# Patient Record
Sex: Male | Born: 1965 | Race: White | Hispanic: No | Smoking: Never smoker
Health system: Southern US, Community
[De-identification: ages and names within clinical notes are randomized; demographics above are authoritative.]

## PROBLEM LIST (undated history)

## (undated) DIAGNOSIS — F32A Depression, unspecified: Secondary | ICD-10-CM

## (undated) DIAGNOSIS — E1110 Type 2 diabetes mellitus with ketoacidosis without coma: Secondary | ICD-10-CM

## (undated) DIAGNOSIS — I10 Essential (primary) hypertension: Secondary | ICD-10-CM

## (undated) DIAGNOSIS — K219 Gastro-esophageal reflux disease without esophagitis: Secondary | ICD-10-CM

## (undated) DIAGNOSIS — E7800 Pure hypercholesterolemia, unspecified: Secondary | ICD-10-CM

## (undated) DIAGNOSIS — F329 Major depressive disorder, single episode, unspecified: Secondary | ICD-10-CM

## (undated) HISTORY — PX: EYE SURGERY: SHX253

## (undated) HISTORY — PX: VASECTOMY: SHX75

---

## 1999-07-08 ENCOUNTER — Encounter: Payer: Self-pay | Admitting: Emergency Medicine

## 1999-07-08 ENCOUNTER — Emergency Department (HOSPITAL_COMMUNITY): Admission: EM | Admit: 1999-07-08 | Discharge: 1999-07-08 | Payer: Self-pay | Admitting: Emergency Medicine

## 2005-03-29 ENCOUNTER — Emergency Department (HOSPITAL_COMMUNITY): Admission: EM | Admit: 2005-03-29 | Discharge: 2005-03-29 | Payer: Self-pay | Admitting: Family Medicine

## 2005-06-27 ENCOUNTER — Emergency Department (HOSPITAL_COMMUNITY): Admission: EM | Admit: 2005-06-27 | Discharge: 2005-06-27 | Payer: Self-pay | Admitting: Emergency Medicine

## 2005-12-18 ENCOUNTER — Emergency Department (HOSPITAL_COMMUNITY): Admission: EM | Admit: 2005-12-18 | Discharge: 2005-12-18 | Payer: Self-pay | Admitting: Family Medicine

## 2007-02-11 DIAGNOSIS — H359 Unspecified retinal disorder: Secondary | ICD-10-CM | POA: Insufficient documentation

## 2009-08-02 DIAGNOSIS — E559 Vitamin D deficiency, unspecified: Secondary | ICD-10-CM | POA: Insufficient documentation

## 2011-06-22 ENCOUNTER — Emergency Department (HOSPITAL_COMMUNITY)
Admission: EM | Admit: 2011-06-22 | Discharge: 2011-06-22 | Disposition: A | Discharge status: Home / Self Care | Payer: Self-pay | Source: Home / Self Care | Attending: Emergency Medicine | Admitting: Emergency Medicine

## 2011-06-22 ENCOUNTER — Encounter (HOSPITAL_COMMUNITY): Payer: Self-pay | Admitting: *Deleted

## 2011-06-22 DIAGNOSIS — A938 Other specified arthropod-borne viral fevers: Secondary | ICD-10-CM

## 2011-06-22 HISTORY — DX: Pure hypercholesterolemia, unspecified: E78.00

## 2011-06-22 HISTORY — DX: Major depressive disorder, single episode, unspecified: F32.9

## 2011-06-22 HISTORY — DX: Depression, unspecified: F32.A

## 2011-06-22 HISTORY — DX: Essential (primary) hypertension: I10

## 2011-06-22 LAB — GLUCOSE, CAPILLARY: Glucose-Capillary: 153 mg/dL — ABNORMAL HIGH (ref 70–99)

## 2011-06-22 MED ORDER — DOXYCYCLINE HYCLATE 100 MG PO CAPS: 100.0000 mg | ORAL_CAPSULE | Freq: Two times a day (BID) | ORAL | Status: AC

## 2011-06-22 NOTE — ED Provider Notes (Signed)
History     CSN: [id number]  Arrival date & time 06/22/11  1727   First MD Initiated Contact with Patient 06/22/11 1828      Chief Complaint  Patient presents with  . Rash  . Joint Pain  . Headache    (Consider location/radiation/quality/duration/timing/severity/associated sxs/prior treatment) HPI Comments: Patient states that he removed an embedded tick behind his right knee one week ago. Patient now reports 3 painful, target  lesions on his right lower extremity, one behind his knee, and 2 on the anterior aspect of his right lower leg. Reports right knee and left wrist arthralgias, diffuse headache without photophobia, nausea, vomiting, neck stiffness. He has been taking 800 mg ibuprofen and 1 g of Tylenol with mild relief of the headache and arthralgias. No aggravating factors. No conjunctivitis. No rash. No dysarthria, facial droop, blurry vision. No chest pain, shortness of breath, abdominal pain. Patient is a type I diabetic, on insulin pump. States glucose has  has been normal for him. States his tetanus is up-to-date.  Patient is a 46 y.o. male presenting with rash. The history is provided by the patient. No language interpreter was used.  Rash  This is a new problem. The current episode started more than 2 days ago. The problem has been gradually worsening. The problem is associated with an insect bite/sting. There has been no fever. The rash is present on the right lower leg. The pain has been constant since onset. He has tried OTC analgesics for the symptoms. The treatment provided mild relief.    Past Medical History  Diagnosis Date  . Diabetes mellitus   . Hypertension   . Hypercholesteremia   . Depression     Past Surgical History  Procedure Date  . Eye surgery     1992  . Vasectomy     History reviewed. No pertinent family history.  History  Substance Use Topics  . Smoking status: Never Smoker   . Smokeless tobacco: Not on file  . Alcohol Use: No       Review of Systems  Constitutional: Negative for fever.  Eyes: Negative for photophobia, pain and redness.  Cardiovascular: Negative for chest pain.  Gastrointestinal: Negative for vomiting.  Musculoskeletal: Positive for myalgias.  Skin: Positive for rash.  Neurological: Positive for headaches. Negative for dizziness and weakness.    Allergies  Review of patient's allergies indicates no known allergies.  Home Medications   Current Outpatient Rx  Name Route Sig Dispense Refill  . ACETAMINOPHEN 500 MG PO TABS Oral Take 500 mg by mouth every 6 (six) hours as needed.    Marland Kitchen AMLODIPINE BESYLATE 10 MG PO TABS Oral Take 10 mg by mouth daily.    . ARIPIPRAZOLE 20 MG PO TABS Oral Take 20 mg by mouth daily.    . ATORVASTATIN CALCIUM 20 MG PO TABS Oral Take 20 mg by mouth daily.    . BUPROPION HCL ER (XL) 300 MG PO TB24 Oral Take 300 mg by mouth daily.    . IBUPROFEN 800 MG PO TABS Oral Take 800 mg by mouth every 8 (eight) hours as needed.    Marland Kitchen NOVOLOG [place] Subcutaneous Inject into the skin. Sliding scale; insulin pump    . PAXIL PO Oral Take by mouth.    . DOXYCYCLINE HYCLATE 100 MG PO CAPS Oral Take 1 capsule (100 mg total) by mouth 2 (two) times daily. X 21 days 42 capsule 0    BP 158/87  Pulse 110  Temp(Src) 98.3  F (36.8 C) (Oral)  Resp 16  SpO2 96%  Physical Exam  Nursing note and vitals reviewed. Constitutional: He is oriented to person, place, and time. He appears well-developed and well-nourished. No distress.  HENT:  Head: Normocephalic and atraumatic.  Eyes: Conjunctivae and EOM are normal. Pupils are equal, round, and reactive to light.  Neck: Normal range of motion. Neck supple. Normal range of motion present.  Cardiovascular: Normal rate, regular rhythm and normal heart sounds.   Pulmonary/Chest: Effort normal and breath sounds normal. No respiratory distress.  Abdominal: He exhibits no distension.  Musculoskeletal: Normal range of motion.       Legs:       Patient able to walk, do full range of motion right knee.  No joint edema, erythema, redness.  Neurological: He is alert and oriented to person, place, and time. No cranial nerve deficit or sensory deficit.       No gross motor deficits  Skin: Skin is warm and dry.       see musculoskeletal exam. No other rash anywhere else.  Psychiatric: He has a normal mood and affect. His behavior is normal.    ED Course  Procedures (including critical care time)  Labs Reviewed  GLUCOSE, CAPILLARY - Abnormal; Notable for the following:    Glucose-Capillary 153 (*)    All other components within normal limits  ROCKY MTN SPOTTED FVR AB, IGM-BLOOD  ROCKY MTN SPOTTED FVR AB, IGG-BLOOD   No results found.   1. Tick borne fever     EKG, rate 84. Normal axis, normal intervals. No hypertrophy. No ST-T wave changes. No previous EKG for comparison.  MDM  Checking EKG, sending off RMSF titers. No evidence of septic joint, meningitis, Bell's palsy, EKG abnormalities at this time. Will send him home with doxycycline 100 mg twice a day for 21 days which will cover RMSF, lyme and ehrichoisis. Discussed signs and symptoms that should prompt his return to the ED, patient agrees with plan.  Luiz Blare, MD 06/23/11 [id number]

## 2011-06-22 NOTE — ED Notes (Signed)
Updated pt on wait.  Offered refreshments.  Pt becoming very upset with wait.  Discussed with pt; pt calmed down.  Told he is next to be seen by Dr. Chaney Malling.

## 2011-06-22 NOTE — Discharge Instructions (Signed)
Return for signs of Bell's palsy, meningitis, descending tick paralysis, fever above 100.4, or any other concerns

## 2011-06-22 NOTE — ED Notes (Signed)
Patient is resting comfortably.  Refreshments offered.  Updated on wait.

## 2011-06-22 NOTE — ED Notes (Signed)
Reports tick bite behind right knee approx 1 wk ago.  4 days ago started with 3 very large circular areas (was aware of 2; could not see the one behind his knee) of discoloration with pink borders to right lower leg, constant HA, right knee joint pain, left hand pain, and general malaise.  Denies fevers.  Reports CBG = 118 prior to dinner today.

## 2011-06-24 LAB — ROCKY MTN SPOTTED FVR AB, IGM-BLOOD: RMSF IgM: 0.11 IV (ref 0.00–0.89)

## 2011-07-09 ENCOUNTER — Inpatient Hospital Stay (HOSPITAL_COMMUNITY)
Admission: EM | Admit: 2011-07-09 | Discharge: 2011-07-10 | DRG: 638 | Disposition: A | Discharge status: Home / Self Care | Payer: 59 | Attending: Infectious Disease | Admitting: Infectious Disease

## 2011-07-09 ENCOUNTER — Emergency Department (HOSPITAL_COMMUNITY): Payer: 59

## 2011-07-09 ENCOUNTER — Encounter (HOSPITAL_COMMUNITY): Payer: Self-pay | Admitting: *Deleted

## 2011-07-09 DIAGNOSIS — F329 Major depressive disorder, single episode, unspecified: Secondary | ICD-10-CM | POA: Diagnosis present

## 2011-07-09 DIAGNOSIS — E1110 Type 2 diabetes mellitus with ketoacidosis without coma: Secondary | ICD-10-CM | POA: Diagnosis present

## 2011-07-09 DIAGNOSIS — Z79899 Other long term (current) drug therapy: Secondary | ICD-10-CM

## 2011-07-09 DIAGNOSIS — E109 Type 1 diabetes mellitus without complications: Secondary | ICD-10-CM | POA: Diagnosis present

## 2011-07-09 DIAGNOSIS — E785 Hyperlipidemia, unspecified: Secondary | ICD-10-CM | POA: Diagnosis present

## 2011-07-09 DIAGNOSIS — E7800 Pure hypercholesterolemia, unspecified: Secondary | ICD-10-CM | POA: Diagnosis present

## 2011-07-09 DIAGNOSIS — E871 Hypo-osmolality and hyponatremia: Secondary | ICD-10-CM | POA: Diagnosis present

## 2011-07-09 DIAGNOSIS — Z794 Long term (current) use of insulin: Secondary | ICD-10-CM

## 2011-07-09 DIAGNOSIS — E1010 Type 1 diabetes mellitus with ketoacidosis without coma: Secondary | ICD-10-CM

## 2011-07-09 DIAGNOSIS — Z9641 Presence of insulin pump (external) (internal): Secondary | ICD-10-CM

## 2011-07-09 DIAGNOSIS — N179 Acute kidney failure, unspecified: Secondary | ICD-10-CM | POA: Diagnosis present

## 2011-07-09 DIAGNOSIS — F3289 Other specified depressive episodes: Secondary | ICD-10-CM | POA: Diagnosis present

## 2011-07-09 DIAGNOSIS — I10 Essential (primary) hypertension: Secondary | ICD-10-CM | POA: Diagnosis present

## 2011-07-09 DIAGNOSIS — F32A Depression, unspecified: Secondary | ICD-10-CM | POA: Diagnosis present

## 2011-07-09 HISTORY — DX: Type 2 diabetes mellitus with ketoacidosis without coma: E11.10

## 2011-07-09 LAB — BASIC METABOLIC PANEL
BUN: 37 mg/dL — ABNORMAL HIGH (ref 6–23)
BUN: 34 mg/dL — ABNORMAL HIGH (ref 6–23)
BUN: 32 mg/dL — ABNORMAL HIGH (ref 6–23)
CO2: 14 mEq/L — ABNORMAL LOW (ref 19–32)
CO2: 17 mEq/L — ABNORMAL LOW (ref 19–32)
Calcium: 9.9 mg/dL (ref 8.4–10.5)
Calcium: 8.9 mg/dL (ref 8.4–10.5)
Chloride: 101 mEq/L (ref 96–112)
Creatinine, Ser: 1.64 mg/dL — ABNORMAL HIGH (ref 0.50–1.35)
GFR calc Af Amer: 66 mL/min — ABNORMAL LOW (ref >90)
GFR calc Af Amer: 68 mL/min — ABNORMAL LOW (ref >90)
GFR calc non Af Amer: 54 mL/min — ABNORMAL LOW (ref >90)
GFR calc non Af Amer: 57 mL/min — ABNORMAL LOW (ref >90)
GFR calc non Af Amer: 59 mL/min — ABNORMAL LOW (ref >90)
Glucose, Bld: 573 mg/dL (ref 70–99)
Glucose, Bld: 307 mg/dL — ABNORMAL HIGH (ref 70–99)
Glucose, Bld: 261 mg/dL — ABNORMAL HIGH (ref 70–99)
Potassium: 4.1 mEq/L (ref 3.5–5.1)
Potassium: 4.4 mEq/L (ref 3.5–5.1)
Sodium: 132 mEq/L — ABNORMAL LOW (ref 135–145)
Sodium: 133 mEq/L — ABNORMAL LOW (ref 135–145)
Sodium: 133 mEq/L — ABNORMAL LOW (ref 135–145)

## 2011-07-09 LAB — URINALYSIS, ROUTINE W REFLEX MICROSCOPIC
Bilirubin Urine: NEGATIVE
Glucose, UA: >1000 mg/dL — AB
Hgb urine dipstick: NEGATIVE
Protein, ur: NEGATIVE mg/dL
Urobilinogen, UA: 0.2 mg/dL (ref 0.0–1.0)

## 2011-07-09 LAB — RAPID URINE DRUG SCREEN, HOSP PERFORMED
Barbiturates: NOT DETECTED
Benzodiazepines: NOT DETECTED
Cocaine: NOT DETECTED

## 2011-07-09 LAB — CBC
HCT: 40.2 % (ref 39.0–52.0)
HCT: 34.3 % — ABNORMAL LOW (ref 39.0–52.0)
Hemoglobin: 12.2 g/dL — ABNORMAL LOW (ref 13.0–17.0)
MCH: 31.3 pg (ref 26.0–34.0)
MCV: 88 fL (ref 78.0–100.0)
Platelets: 309 10*3/uL (ref 150–400)
RBC: 3.95 MIL/uL — ABNORMAL LOW (ref 4.22–5.81)
RDW: 12.3 % (ref 11.5–15.5)

## 2011-07-09 LAB — GLUCOSE, CAPILLARY
Glucose-Capillary: 378 mg/dL — ABNORMAL HIGH (ref 70–99)
Glucose-Capillary: 150 mg/dL — ABNORMAL HIGH (ref 70–99)

## 2011-07-09 LAB — URINE MICROSCOPIC-ADD ON

## 2011-07-09 MED ORDER — DEXTROSE-NACL 5-0.45 % IV SOLN: INTRAVENOUS | Status: DC

## 2011-07-09 MED ORDER — ONDANSETRON HCL 4 MG/2ML IJ SOLN
INTRAMUSCULAR | Status: AC
  Filled 2011-07-09: qty 2

## 2011-07-09 MED ORDER — SODIUM CHLORIDE 0.9 % IV SOLN: INTRAVENOUS | Status: DC

## 2011-07-09 MED ORDER — DEXTROSE-NACL 5-0.45 % IV SOLN
INTRAVENOUS | Status: DC
  Administered 2011-07-09: 125 mL/h via INTRAVENOUS

## 2011-07-09 MED ORDER — PAROXETINE HCL 20 MG PO TABS
40.0000 mg | ORAL_TABLET | Freq: Every day | ORAL | Status: DC
  Administered 2011-07-10: 40 mg via ORAL
  Filled 2011-07-09: qty 2

## 2011-07-09 MED ORDER — DEXTROSE 50 % IV SOLN: 25.0000 mL | INTRAVENOUS | Status: DC | PRN

## 2011-07-09 MED ORDER — HEPARIN SODIUM (PORCINE) 5000 UNIT/ML IJ SOLN
5000.0000 [IU] | Freq: Three times a day (TID) | INTRAMUSCULAR | Status: DC
  Administered 2011-07-09 – 2011-07-10 (×2): 5000 [IU] via SUBCUTANEOUS
  Filled 2011-07-09 (×5): qty 1

## 2011-07-09 MED ORDER — POTASSIUM CHLORIDE 10 MEQ/100ML IV SOLN
10.0000 meq | INTRAVENOUS | Status: AC
  Administered 2011-07-09 (×2): 10 meq via INTRAVENOUS
  Filled 2011-07-09: qty 200

## 2011-07-09 MED ORDER — ATORVASTATIN CALCIUM 40 MG PO TABS
40.0000 mg | ORAL_TABLET | Freq: Every day | ORAL | Status: DC
  Administered 2011-07-10: 40 mg via ORAL
  Filled 2011-07-09: qty 1

## 2011-07-09 MED ORDER — DOXYCYCLINE HYCLATE 100 MG PO TABS
100.0000 mg | ORAL_TABLET | Freq: Two times a day (BID) | ORAL | Status: DC
  Administered 2011-07-09 – 2011-07-10 (×2): 100 mg via ORAL
  Filled 2011-07-09 (×4): qty 1

## 2011-07-09 MED ORDER — ARIPIPRAZOLE 2 MG PO TABS
2.0000 mg | ORAL_TABLET | Freq: Every day | ORAL | Status: DC
  Administered 2011-07-10: 2 mg via ORAL
  Filled 2011-07-09: qty 1

## 2011-07-09 MED ORDER — CHLORHEXIDINE GLUCONATE CLOTH 2 % EX PADS
6.0000 | MEDICATED_PAD | Freq: Every day | CUTANEOUS | Status: DC
  Administered 2011-07-10: 6 via TOPICAL

## 2011-07-09 MED ORDER — AMLODIPINE BESYLATE 10 MG PO TABS
10.0000 mg | ORAL_TABLET | Freq: Every day | ORAL | Status: DC
  Administered 2011-07-10: 10 mg via ORAL
  Filled 2011-07-09: qty 1

## 2011-07-09 MED ORDER — SODIUM CHLORIDE 0.9 % IV BOLUS (SEPSIS)
500.0000 mL | Freq: Once | INTRAVENOUS | Status: DC
  Administered 2011-07-09: 500 mL via INTRAVENOUS

## 2011-07-09 MED ORDER — ONDANSETRON HCL 4 MG/2ML IJ SOLN
4.0000 mg | Freq: Once | INTRAMUSCULAR | Status: AC
  Administered 2011-07-09: 8 mg via INTRAVENOUS

## 2011-07-09 MED ORDER — SODIUM CHLORIDE 0.9 % IV SOLN
INTRAVENOUS | Status: DC
  Administered 2011-07-09: 5.1 [IU]/h via INTRAVENOUS
  Filled 2011-07-09 (×2): qty 1

## 2011-07-09 MED ORDER — BUPROPION HCL ER (XL) 300 MG PO TB24
300.0000 mg | ORAL_TABLET | Freq: Every day | ORAL | Status: DC
  Administered 2011-07-10: 300 mg via ORAL
  Filled 2011-07-09: qty 1

## 2011-07-09 MED ORDER — MUPIROCIN 2 % EX OINT
1.0000 "application " | TOPICAL_OINTMENT | Freq: Two times a day (BID) | CUTANEOUS | Status: DC
  Administered 2011-07-09 – 2011-07-10 (×2): 1 via NASAL
  Filled 2011-07-09: qty 22

## 2011-07-09 MED ORDER — INSULIN GLARGINE 100 UNIT/ML ~~LOC~~ SOLN
10.0000 [IU] | Freq: Every day | SUBCUTANEOUS | Status: DC
  Administered 2011-07-09: 10 [IU] via SUBCUTANEOUS

## 2011-07-09 MED ORDER — SODIUM CHLORIDE 0.9 % IV SOLN
INTRAVENOUS | Status: DC
  Administered 2011-07-09: 250 mL/h via INTRAVENOUS
  Administered 2011-07-09: 15:00:00 via INTRAVENOUS

## 2011-07-09 MED ORDER — HEPARIN SODIUM (PORCINE) 5000 UNIT/ML IJ SOLN: 5000.0000 [IU] | Freq: Three times a day (TID) | INTRAMUSCULAR | Status: DC

## 2011-07-09 MED ORDER — POTASSIUM CHLORIDE 10 MEQ/100ML IV SOLN: 10.0000 meq | INTRAVENOUS | Status: DC

## 2011-07-09 MED ORDER — INSULIN REGULAR BOLUS VIA INFUSION
0.0000 [IU] | Freq: Three times a day (TID) | INTRAVENOUS | Status: DC
  Filled 2011-07-09: qty 10

## 2011-07-09 MED ORDER — SODIUM CHLORIDE 0.9 % IV BOLUS (SEPSIS): 500.0000 mL | Freq: Once | INTRAVENOUS | Status: AC

## 2011-07-09 MED ORDER — INSULIN GLARGINE 100 UNIT/ML ~~LOC~~ SOLN: 10.0000 [IU] | Freq: Every day | SUBCUTANEOUS | Status: DC

## 2011-07-09 MED ORDER — SODIUM CHLORIDE 0.9 % IV SOLN
INTRAVENOUS | Status: DC
  Administered 2011-07-09: 3 [IU]/h via INTRAVENOUS
  Filled 2011-07-09: qty 1

## 2011-07-09 NOTE — ED Provider Notes (Signed)
History     CSN: [id number]  Arrival date & time 07/09/11  0847   First MD Initiated Contact with Patient 07/09/11 [phone]      Chief Complaint  Patient presents with  . Hyperglycemia    (Consider location/radiation/quality/duration/timing/severity/associated sxs/prior treatment) HPI Comments: 46 yo male with T1DM on insulin pump presents with 2 days of worsening hyperglycemia. First noted higher blood sugar 2 night ago. He changed out pump site several times, bought new insulin, has not eaten full meals, and given several extra boluses but continues to increase. Yesterday values ranged from 370-430. He had some nausea and malaise. This morning woke up with emesis x 2.Notably he is taking doxycycline for tick fever and rash which has improved. 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 He last had DKA as a young child, and normally has excellent glycemic control using a total of ~20 units novolog daily via pump. So far he has taken 25 units in past 10 hours.  Pump managed by NP Cherylann Ratel in [place] at [facility].  Has some heartburn and mild headache, otherwise denies any fevers, cough, dyspnea, pain, rash, diarrhea.    Past Medical History  Diagnosis Date  . Diabetes mellitus   . Hypertension   . Hypercholesteremia   . Depression     Past Surgical History  Procedure Date  . Eye surgery     1992  . Vasectomy     History reviewed. No pertinent family history.  History  Substance Use Topics  . Smoking status: Never Smoker   . Smokeless tobacco: Not on file  . Alcohol Use: No      Review of Systems  Constitutional: Negative for fever and chills.  HENT: Negative for congestion and neck stiffness.   Respiratory: Negative for cough and shortness of breath.   Cardiovascular: Negative for chest pain and leg swelling.  Genitourinary: Negative for dysuria, urgency and difficulty urinating.  Musculoskeletal: Negative for joint  swelling.  Neurological: Negative for dizziness and syncope.  Psychiatric/Behavioral: Negative for confusion.  All other systems reviewed and are negative.    Allergies  Review of patient's allergies indicates no known allergies.  Home Medications   Current Outpatient Rx  Name Route Sig Dispense Refill  . ACETAMINOPHEN 500 MG PO TABS Oral Take 500 mg by mouth every 6 (six) hours as needed. For pain    . AMLODIPINE BESYLATE 10 MG PO TABS Oral Take 10 mg by mouth daily.    . ARIPIPRAZOLE 2 MG PO TABS Oral Take 2 mg by mouth daily.    . ATORVASTATIN CALCIUM 40 MG PO TABS Oral Take 40 mg by mouth daily.    . BUPROPION HCL ER (XL) 300 MG PO TB24 Oral Take 300 mg by mouth daily.    Marland Kitchen DOXYCYCLINE HYCLATE 100 MG PO TABS Oral Take 100 mg by mouth 2 (two) times daily.    . IBUPROFEN 800 MG PO TABS Oral Take 800 mg by mouth every 8 (eight) hours as needed. For pain    . NOVOLOG  Subcutaneous Inject into the skin. Sliding scale; insulin pump    . PAXIL PO Oral Take 40 mg by mouth daily.       There were no vitals taken for this visit.  Physical Exam  Vitals reviewed. Constitutional: He is oriented to person, place, and time. He appears well-developed and well-nourished.  HENT:  Head: Normocephalic and atraumatic.  Nose: Nose normal.  Mouth/Throat:  Oropharynx is clear and moist. No oropharyngeal exudate.  Eyes: EOM are normal.  Cardiovascular: Normal rate, regular rhythm, normal heart sounds and intact distal pulses.  Exam reveals no gallop.   No murmur heard. Pulmonary/Chest: Effort normal and breath sounds normal. No respiratory distress. He has no wheezes. He has no rales.  Abdominal: Soft. Bowel sounds are normal. He exhibits no distension. There is no tenderness. There is no rebound and no guarding.  Musculoskeletal: He exhibits no edema and no tenderness.  Neurological: He is alert and oriented to person, place, and time.  Skin: No rash noted. He is not diaphoretic.       Rash  has resolved.  Psychiatric: He has a normal mood and affect.    ED Course  Procedures (including critical care time)  Labs Reviewed  GLUCOSE, CAPILLARY - Abnormal; Notable for the following:    Glucose-Capillary 547 (*)     All other components within normal limits  BASIC METABOLIC PANEL - Abnormal; Notable for the following:    Sodium 130 (*)     Chloride 87 (*)     CO2 14 (*)     Glucose, Bld 573 (*)     BUN 37 (*)     Creatinine, Ser 1.64 (*)     GFR calc non Af Amer 49 (*)     GFR calc Af Amer 57 (*)     All other components within normal limits  CBC - Abnormal; Notable for the following:    WBC 12.0 (*)     All other components within normal limits  GLUCOSE, CAPILLARY - Abnormal; Notable for the following:    Glucose-Capillary 583 (*)     All other components within normal limits  MAGNESIUM  URINALYSIS, ROUTINE W REFLEX MICROSCOPIC   Dg Chest 2 View  07/09/2011  *RADIOLOGY REPORT*  Clinical Data: Hyperglycemia, weakness, diabetes  CHEST - 2 VIEW  Comparison: None.  Findings: The lungs are clear.  Mediastinal contours are normal. The heart is within normal limits in size.  No bony abnormality is seen.  IMPRESSION: No active lung disease.  Original Report Authenticated By: Juline Patch, M.D.     1. DKA, type 1       MDM  46 yo male with Type I DM presents with 2 days worsening hyperglycemia despite troubleshooting his insulin pump, now with N/V and DKA with anion gap of 29. Stable mentation and no signs of respiratory distress currently. Etiology uncertain, will check UA, CXR, CBC to assess infectious causes. Could be early gastroenteritis. Seems to have improvement from tick fever and RMSF titers were negative. Will start IV hydration and glucose stabilizer. Plan to admit for glucose control and acidosis management.         Durwin Reges, MD 07/09/11 1118

## 2011-07-09 NOTE — H&P (Signed)
Hospital Admission Note Date: 07/09/2011  Patient name: Jonathan Colon Medical record number: [id number] Date of birth: 1966-01-10 Age: 46 y.o. Gender: male PCP: Elizabeth Palau, FNP  Medical Service:  Attending physician: Dr. Daiva Eves    1st Contact: Dr. Dierdre Searles     Pager: [phone] 2nd Contact: Dr. Dorthula Rue    Pager: [phone] After 5 pm or weekends: 1st Contact:      Pager: [phone] 2nd Contact:      Pager: [phone]  Chief Complaint: high blood sugars  History of Present Illness: 46 year old man with past medical history significant for type 1 diabetes mellitus since the age of 7 on insulin pump , hypertension, hyperlipidemia comes to the ER for high blood sugars for last 2 days. Patient states that his blood sugars have been running in 400s since this Tuesday. No matter how much insulin he gives himself his blood sugars are still running high. He changed out pump site several times, bought new insulin, has not eaten full meals, and given several extra boluses but continues to increase.He reports having nausea and vomiting for one day. He had some episodes of vomiting today which contained what he ate. Denies noticing any blood. Also denies any fever, chills, abdominal pain, chest pain, shortness of breath, alteration in bowel or bladder habits.  He did eat his lunch and in a restaurant on Tuesday. Notably he is taking doxycycline for tick fever and rash which has improved. 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 He last had DKA as a young child, and normally has excellent glycemic control using a total of ~20 units novolog daily via pump.  Pump managed by NP Cherylann Ratel in [place] at [facility].    Meds: Current Outpatient Rx  Name Route Sig Dispense Refill  . ACETAMINOPHEN 500 MG PO TABS Oral Take 500 mg by mouth every 6 (six) hours as needed. For pain    . AMLODIPINE BESYLATE 10 MG PO TABS Oral Take 10 mg by mouth daily.    . ARIPIPRAZOLE 2  MG PO TABS Oral Take 2 mg by mouth daily.    . ATORVASTATIN CALCIUM 40 MG PO TABS Oral Take 40 mg by mouth daily.    . BUPROPION HCL ER (XL) 300 MG PO TB24 Oral Take 300 mg by mouth daily.    Marland Kitchen DOXYCYCLINE HYCLATE 100 MG PO TABS Oral Take 100 mg by mouth 2 (two) times daily.    . IBUPROFEN 800 MG PO TABS Oral Take 800 mg by mouth every 8 (eight) hours as needed. For pain    . NOVOLOG [place] Subcutaneous Inject into the skin. Sliding scale; insulin pump    . PAXIL PO Oral Take 40 mg by mouth daily.       Allergies: Allergies as of 07/09/2011  . (No Known Allergies)   Past Medical History  Diagnosis Date  . Diabetes mellitus   . Hypertension   . Hypercholesteremia   . Depression    Past Surgical History  Procedure Date  . Eye surgery     1992  . Vasectomy    History reviewed. No pertinent family history. History   Social History  . Marital Status: Married    Spouse Name: N/A    Number of Children: N/A  . Years of Education: N/A   Occupational History  . Not on file.   Social History Main Topics  . Smoking status: Never Smoker   . Smokeless tobacco: Not on  file  . Alcohol Use: No  . Drug Use: No  . Sexually Active: Yes    Birth Control/ Protection: Surgical   Other Topics Concern  . Not on file   Social History Narrative  . No narrative on file    Review of Systems: Pertinent items are noted in HPI.  Physical Exam: Blood pressure 119/66, pulse 113, temperature 97.8 F (36.6 C), temperature source Oral, resp. rate 22, SpO2 98.00%. BP 119/66  Pulse 113  Temp 97.8 F (36.6 C) (Oral)  Resp 22  SpO2 98%  General Appearance:    Alert, cooperative, no distress, appears stated age  Head:    Normocephalic, without obvious abnormality, atraumatic  Eyes:    PERRL, conjunctiva/corneas clear, EOM's intact, fundi    benign, both eyes       Ears:    Normal TM's and external ear canals, both ears  Nose:   Nares normal, septum midline, mucosa normal, no drainage    or  sinus tenderness  Throat:   Lips, mucosa, and tongue normal; teeth and gums normal  Neck:   Supple, symmetrical, trachea midline, no adenopathy;       thyroid:  No enlargement/tenderness/nodules; no carotid   bruit or JVD  Back:     Symmetric, no curvature, ROM normal, no CVA tenderness  Lungs:     Clear to auscultation bilaterally, respirations unlabored  Chest wall:    No tenderness or deformity  Heart:    Regular rate and rhythm, S1 and S2 normal, no murmur, rub   or gallop  Abdomen:     Soft, non-tender, bowel sounds active all four quadrants,    no masses, no organomegaly  Genitalia:    Normal male without lesion, discharge or tenderness  Rectal:    Normal tone, normal prostate, no masses or tenderness;   guaiac negative stool  Extremities:   Extremities normal, atraumatic, no cyanosis or edema. Rash resolved  Pulses:   2+ and symmetric all extremities  Skin:   Skin color, texture, turgor normal, no rashes or lesions  Lymph nodes:   Cervical, supraclavicular, and axillary nodes normal  Neurologic:   CNII-XII intact. Normal strength, sensation and reflexes      throughout    Lab results: Basic Metabolic Panel:  Basename 07/09/11 0925  NA 130*  K 5.0  CL 87*  CO2 14*  GLUCOSE 573*  BUN 37*  CREATININE 1.64*  CALCIUM 9.9  MG 2.1  PHOS --   Liver Function Tests: CBC:  Basename 07/09/11 0925  WBC 12.0*  NEUTROABS --  HGB 14.3  HCT 40.2  MCV 88.0  PLT 309    CBG:  Basename 07/09/11 1300 07/09/11 1147 07/09/11 1037 07/09/11 0851  GLUCAP 378* 429* 583* 547*   Alcohol Level: No results found for this basename: ETH:2 in the last 72 hours Urinalysis:  Basename 07/09/11 1039  COLORURINE YELLOW  LABSPEC 1.026  PHURINE 5.5  GLUCOSEU >1000*  HGBUR NEGATIVE  BILIRUBINUR NEGATIVE  KETONESUR 40*  PROTEINUR NEGATIVE  UROBILINOGEN 0.2  NITRITE NEGATIVE  LEUKOCYTESUR NEGATIVE     Imaging results:  Dg Chest 2 View  07/09/2011  *RADIOLOGY REPORT*  Clinical  Data: Hyperglycemia, weakness, diabetes  CHEST - 2 VIEW  Comparison: None.  Findings: The lungs are clear.  Mediastinal contours are normal. The heart is within normal limits in size.  No bony abnormality is seen.  IMPRESSION: No active lung disease.  Original Report Authenticated By: Juline Patch, M.D.  Assessment & Plan by Problem:   1. DKA (diabetic ketoacidoses): Patient type 1 diabetes since the age of 77 , and presents with DKA with anion gap of 29 and bicarbonate of 14 and urinary ketones. Etiology of his DKA is not clear. His UA is clear. Chest x-ray does not show any evidence of pneumonia. He has been taking doxycycline for possible Lyme disease since Memorial day and his rash has resolved completely. He is on insulin pump and has been managing his diabetes well. - Admit to step down. - Continue him on glucose stabilizer. - Agree with IV fluids at 250  cc per hour - Check A1c. - Hold insulin pump for now till DKA was resolved.  2. Pseudohyponatremia:  from hyperglycemia. Continue to monitor BMET for now.  3. Acute renal failure: He presented with Cr of 1.64. Baseline not known. Check FeNA. It could be prerenal from dehydration.  4. HTN (hypertension): Blood pressure is well-controlled. Restart home meds when he starts taking PO.   5. HLD (hyperlipidemia): Check FLP in AM. Would continue on home meds.   6. Depression: Denies any suicidal thoughts or ideations. He would start him on home medicines when he would start taking PO.   7. DVT: Heparin   Signed: Jase Reep 07/09/2011, 1:10 PM

## 2011-07-09 NOTE — ED Notes (Signed)
Pt reports day before last noted blood sugars to be high, pt with insulin pump is on novolog. Reports has changed site. States nausea and vomiting x 2 this morning. Reports glucose is usually very well controlled at home. Pt reports recent dx of lyme disease and has been on antibiotics, and is taking doxy for same.

## 2011-07-09 NOTE — ED Notes (Signed)
cbg 583

## 2011-07-10 LAB — BASIC METABOLIC PANEL
BUN: 17 mg/dL (ref 6–23)
CO2: 24 mEq/L (ref 19–32)
Chloride: 104 mEq/L (ref 96–112)
Chloride: 106 mEq/L (ref 96–112)
Creatinine, Ser: 1.18 mg/dL (ref 0.50–1.35)
GFR calc Af Amer: 73 mL/min — ABNORMAL LOW (ref >90)
GFR calc Af Amer: 85 mL/min — ABNORMAL LOW (ref >90)
GFR calc non Af Amer: 74 mL/min — ABNORMAL LOW (ref >90)
Potassium: 3.9 mEq/L (ref 3.5–5.1)
Potassium: 4.1 mEq/L (ref 3.5–5.1)
Sodium: 140 mEq/L (ref 135–145)

## 2011-07-10 LAB — HEMOGLOBIN A1C
Hgb A1c MFr Bld: 8.4 % — ABNORMAL HIGH (ref <5.7)
Mean Plasma Glucose: 194 mg/dL — ABNORMAL HIGH (ref <117)

## 2011-07-10 LAB — GLUCOSE, CAPILLARY
Glucose-Capillary: 134 mg/dL — ABNORMAL HIGH (ref 70–99)
Glucose-Capillary: 62 mg/dL — ABNORMAL LOW (ref 70–99)

## 2011-07-10 LAB — CBC
MCH: 31.4 pg (ref 26.0–34.0)
Platelets: 270 10*3/uL (ref 150–400)
RBC: 3.98 MIL/uL — ABNORMAL LOW (ref 4.22–5.81)
RDW: 12.3 % (ref 11.5–15.5)
WBC: 10.1 10*3/uL (ref 4.0–10.5)

## 2011-07-10 LAB — LIPID PANEL
Cholesterol: 152 mg/dL (ref 0–200)
Total CHOL/HDL Ratio: 2.8 RATIO
Triglycerides: 119 mg/dL (ref <150)
VLDL: 24 mg/dL (ref 0–40)

## 2011-07-10 MED ORDER — INSULIN GLARGINE 100 UNIT/ML ~~LOC~~ SOLN: 10.0000 [IU] | Freq: Every day | SUBCUTANEOUS | Status: DC

## 2011-07-10 MED ORDER — SODIUM CHLORIDE 0.9 % IV SOLN
INTRAVENOUS | Status: DC
  Administered 2011-07-10 (×2): via INTRAVENOUS

## 2011-07-10 MED ORDER — INSULIN ASPART 100 UNIT/ML ~~LOC~~ SOLN
0.0000 [IU] | SUBCUTANEOUS | Status: DC
  Administered 2011-07-10: 1 [IU] via SUBCUTANEOUS
  Administered 2011-07-10: 5 [IU] via SUBCUTANEOUS
  Administered 2011-07-10: 7 [IU] via SUBCUTANEOUS

## 2011-07-10 NOTE — Discharge Summary (Signed)
Internal Medicine Teaching [facility] Discharge Note  Name: Jonathan Colon MRN: [id number] DOB: 1965/08/24 46 y.o.  Date of Admission: 07/09/2011  8:50 AM Date of Discharge: 07/10/2011 Attending Physician: Dr. Paulette Blanch [place]  Discharge Diagnosis:  1. DKA (diabetic ketoacidoses)  2. DM type 1 (diabetes mellitus, type 1)  3. HTN (hypertension)  4. HLD (hyperlipidemia)  5. Depression   Discharge Medications: Medication List  As of 07/13/2011 10:47 AM   TAKE these medications         acetaminophen 500 MG tablet   Commonly known as: TYLENOL   Take 500 mg by mouth every 6 (six) hours as needed. For pain      amLODipine 10 MG tablet   Commonly known as: NORVASC   Take 10 mg by mouth daily.      ARIPiprazole 2 MG tablet   Commonly known as: ABILIFY   Take 2 mg by mouth daily.      atorvastatin 40 MG tablet   Commonly known as: LIPITOR   Take 40 mg by mouth daily.      doxycycline 100 MG tablet   Commonly known as: VIBRA-TABS   Take 100 mg by mouth 2 (two) times daily.      ibuprofen 800 MG tablet   Commonly known as: ADVIL,MOTRIN   Take 800 mg by mouth every 8 (eight) hours as needed. For pain      insulin glargine 100 UNIT/ML injection   Commonly known as: LANTUS   Inject 10 Units into the skin at bedtime.      NOVOLOG [place]   Inject into the skin. Sliding scale; insulin pump      PAXIL PO   Take 40 mg by mouth daily.      WELLBUTRIN XL 300 MG 24 hr tablet   Generic drug: buPROPion   Take 300 mg by mouth daily.            Disposition and follow-up:   Jonathan Colon was discharged from [facility] in Stable condition.    Follow-up Appointments: Follow-up Information    Follow up with Elizabeth Palau, FNP. (follow up with her in 1 week as needed)    Contact information:   [address] [facility], [place] [address] [phone]           Consultations:  None Procedures Performed:  Dg Chest 2 View  07/09/2011   *RADIOLOGY REPORT*  Clinical Data: Hyperglycemia, weakness, diabetes  CHEST - 2 VIEW  Comparison: None.  Findings: The lungs are clear.  Mediastinal contours are normal. The heart is within normal limits in size.  No bony abnormality is seen.  IMPRESSION: No active lung disease.  Original Report Authenticated By: Juline Patch, M.D.   Admission HPI:  46 year old man with past medical history significant for type 1 diabetes mellitus since the age of 7 on insulin pump , hypertension, hyperlipidemia comes to the ER for high blood sugars for last 2 days. Patient states that his blood sugars have been running in 400s since this Tuesday. No matter how much insulin he gives himself his blood sugars are still running high. He changed out pump site several times, bought new insulin, has not eaten full meals, and given several extra boluses but continues to increase.He reports having nausea and vomiting for one day. He had some episodes of vomiting today which contained what he ate. Denies noticing any blood. Also denies any fever, chills, abdominal pain, chest pain, shortness of breath,  alteration in bowel or bladder habits. He did eat his lunch and in a restaurant on Tuesday.  Notably he is taking doxycycline for tick fever and rash which has improved. 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 He last had DKA as a young child, and normally has excellent glycemic control using a total of ~20 units novolog daily via pump.  Pump managed by NP Cherylann Ratel in [place] at [facility] clinic.   Hospital Course by problem list:  # DKA (diabetic ketoacidoses)   Patient with type 1 diabetes since the age of 7 presents with DKA with anion gap of 29 and bicarbonate of 14 and urinary ketones. Etiology of his DKA is not clear. Given his recent labile CBG monitoring, it could be due to malfunctioning of his insulin pump. Patient was treated with bowel rest, aggressive IVF resuscitation  and glucommander protocol. His DKA was completely resolved upon discharge. He is able to tolerate regular meal well. Patient will be discharged on Lantus 10 units QHS with Novolog SSI for now until he could obtain an new insulin pump. Patient agrees with the plan and is stable to be discharged home.   # DM type 1 (diabetes mellitus, type 1), HGB 8.4     He will continue to follow up with his PCP as an outpatient.   # HTN (hypertension),  HLD (hyperlipidemia),Depression, stable, continue home regimen.     Discharge Vitals:  BP 128/73  Pulse 106  Temp 97.8 F (36.6 C) (Oral)  Resp 14  Ht 5\' 8"  (1.727 m)  Wt 159 lb 6.3 oz (72.3 kg)  BMI 24.24 kg/m2  SpO2 97%  Discharge Labs: No results found for this or any previous visit (from the past 24 hour(s)).  Signed: Nicolasa Milbrath 07/13/2011, 10:47 AM   Time Spent on Discharge: 45 minutes

## 2011-07-10 NOTE — Progress Notes (Signed)
07/10/11  Spoke with patient about his [employer] pump.  Has had it for about 6 years.  States that when his blood sugar was high, he changed sites, tubing, and insulin 3 times.  This continued for about 3 days.  Did not give any subcutaneous insulin during that time.  Patient did a self check on the pump( which was ok) and also called the 800 number for [employer].  The company is sending him a new pump by tomorrow at noon.  States that there were some "button" issues (buttons sticking) over the last few days before his admission.  Will need prescription for Lantus to take at home until new pump is available. Asked patient about sites used for his cannula sites. Seem to be sufficient for absorption. Will continue to follow while in hospital.

## 2011-07-10 NOTE — H&P (Signed)
Internal Medicine Teaching Service Attending Note Date: 07/10/2011  Patient name: Jonathan Colon  Medical record number: [id number]  Date of birth: 1965-10-25    This patient has been seen and discussed with the house staff. Please see their note for complete details. I concur with their findings with the following additions/corrections:  This morning around the patient's anion gap had closed is doing well. We will bridge him with Lantus and injectable sliding scale insulin until his new pump arrives tomorrow. He should be safe for discharge in the interim.  Acey Lav 07/10/2011, 3:02 PM

## 2011-07-10 NOTE — Progress Notes (Signed)
Subjective: Patient feels well. No c/o. Able to tolerate regular meals well.  Objective: Vital signs in last 24 hours: Filed Vitals:   07/10/11 0417 07/10/11 0826 07/10/11 0928 07/10/11 1235  BP: 116/64 123/69 132/71 128/73  Pulse: 90 91  106  Temp: 98.2 F (36.8 C) 97.9 F (36.6 C)  97.8 F (36.6 C)  TempSrc: Oral Oral  Oral  Resp: 15 21  14   Height:      Weight: 159 lb 6.3 oz (72.3 kg)     SpO2: 96% 100%  97%   Weight change:   Intake/Output Summary (Last 24 hours) at 07/10/11 2141 Last data filed at 07/10/11 1238  Gross per 24 hour  Intake   1375 ml  Output   1500 ml  Net   -125 ml   Physical exam: General: NAD Lungs CTA B/L Heart: RRR. Abd: Soft, BS x 4 Ext: No edema  Lab Results: Basic Metabolic Panel:  Lab 07/10/11 1610 07/10/11 0615 07/09/11 0925  Mekayla Soman 135 140 --  K 4.3 4.1 --  CL 103 106 --  CO2 22 24 --  GLUCOSE 245* 81 --  BUN 17 20 --  CREATININE 1.18 1.35 --  CALCIUM 8.4 9.0 --  MG -- -- 2.1  PHOS -- -- --   CBC:  Lab 07/10/11 1016 07/09/11 1742  WBC 10.1 11.2*  NEUTROABS -- --  HGB 12.5* 12.2*  HCT 35.0* 34.3*  MCV 87.9 86.8  PLT 270 281   CBG:  Lab 07/10/11 1236 07/10/11 0824 07/10/11 0415 07/10/11 0023 07/09/11 2154 07/09/11 2043  GLUCAP 310* 62* 134* 278* 142* 110*   Hemoglobin A1C:  Lab 07/09/11 1742  HGBA1C 8.4*   Fasting Lipid Panel:  Lab 07/10/11 0615  CHOL 152  HDL 55  LDLCALC 73  TRIG 119  CHOLHDL 2.8  LDLDIRECT --   Urine Drug Screen: Drugs of Abuse     Component Value Date/Time   LABOPIA NONE DETECTED 07/09/2011 1937   COCAINSCRNUR NONE DETECTED 07/09/2011 1937   LABBENZ NONE DETECTED 07/09/2011 1937   AMPHETMU NONE DETECTED 07/09/2011 1937   THCU NONE DETECTED 07/09/2011 1937   LABBARB NONE DETECTED 07/09/2011 1937    Urinalysis:  Lab 07/09/11 1039  COLORURINE YELLOW  LABSPEC 1.026  PHURINE 5.5  GLUCOSEU >1000*  HGBUR NEGATIVE  BILIRUBINUR NEGATIVE  KETONESUR 40*  PROTEINUR NEGATIVE  UROBILINOGEN 0.2   NITRITE NEGATIVE  LEUKOCYTESUR NEGATIVE   Micro Results: Recent Results (from the past 240 hour(s))  MRSA PCR SCREENING     Status: Abnormal   Collection Time   07/09/11  4:01 PM      Component Value Range Status Comment   MRSA by PCR POSITIVE (*) NEGATIVE Final    Studies/Results: Dg Chest 2 View  07/09/2011  *RADIOLOGY REPORT*  Clinical Data: Hyperglycemia, weakness, diabetes  CHEST - 2 VIEW  Comparison: None.  Findings: The lungs are clear.  Mediastinal contours are normal. The heart is within normal limits in size.  No bony abnormality is seen.  IMPRESSION: No active lung disease.  Original Report Authenticated By: Juline Patch, M.D.   Medications: I have reviewed the patient's current medications. Scheduled Meds:   . DISCONTD: amLODipine  10 mg Oral Daily  . DISCONTD: ARIPiprazole  2 mg Oral Daily  . DISCONTD: atorvastatin  40 mg Oral Daily  . DISCONTD: buPROPion  300 mg Oral Daily  . DISCONTD: Chlorhexidine Gluconate Cloth  6 each Topical Q0600  . DISCONTD: doxycycline  100 mg Oral  BID  . DISCONTD: heparin  5,000 Units Subcutaneous Q8H  . DISCONTD: insulin aspart  0-9 Units Subcutaneous Q4H  . DISCONTD: insulin glargine  10 Units Subcutaneous QHS  . DISCONTD: insulin regular  0-10 Units Intravenous TID WC  . DISCONTD: mupirocin ointment  1 application Nasal BID  . DISCONTD: PARoxetine  40 mg Oral Daily   Continuous Infusions:   . DISCONTD: sodium chloride 125 mL/hr at 07/10/11 0854  . DISCONTD: dextrose 5 % and 0.45% NaCl 125 mL/hr at 07/09/11 2200  . DISCONTD: insulin (NOVOLIN-R) infusion 3 Units/hr (07/09/11 2043)   PRN Meds:.DISCONTD: dextrose Assessment/Plan:  1. DKA (diabetic ketoacidoses): resolved. Patient type 1 diabetes since the age of 58 , and presents with DKA with anion gap of 29 and bicarbonate of 14 and urinary ketones. Etiology of his DKA is not clear. Questionable malfunctioning of his insulin pump. HGB 8.4  -  step down.  - Continue IVF - advance  his diet - DM educator - will d/c home today  2. Pseudohyponatremia: resolved  3. Acute renal failure: resolved, Cr 1.18 4. HTN (hypertension): Blood pressure is well-controlled.  5. HLD (hyperlipidemia): Check FLP in AM. Would continue on home meds.  6. Depression: Denies any suicidal thoughts or ideations. He would start him on home medicines. 7. DVT: Heparin   LOS: 1 day   Karrington Studnicka 07/10/2011, 9:41 PM

## 2011-07-10 NOTE — Progress Notes (Signed)
Utilization Review Completed.  Selah Klang T  07/10/2011  

## 2011-07-10 NOTE — Progress Notes (Signed)
S: Patient feels well. No c/o O: Physical exam:  General: NAD  Lungs CTA B/L  Heart: RRR.  Abd: Soft, BS x 4  Ext: No edema  A/P - will discharge home with Latus 10 units and his home Novolog  - he will receive an new insulin pump in am - follow up with his PCP in one week.

## 2011-07-10 NOTE — Discharge Instructions (Signed)
1. Will discharge you with prescription for lantus 10 units QHS.      Please use Lantus and Novolog SQ if your insulin pump does not work. 2. Please follow up with your PCP in 1 week.

## 2011-07-11 NOTE — ED Provider Notes (Signed)
I saw and evaluated the patient, reviewed the resident's note and I agree with the findings and plan. 46 year old diabetic man who uses an insulin pump. For 2 days his blood sugar has run high, despite adjusting insulin doseage upward.  Lab workup showed diabetic ketoacidosis.  Placed on glucose stabilizer.  MTSB to admit pt.   Carleene Cooper III, MD 07/11/11 [phone]

## 2011-10-19 ENCOUNTER — Encounter (HOSPITAL_COMMUNITY): Payer: Self-pay | Admitting: *Deleted

## 2011-10-19 ENCOUNTER — Emergency Department (HOSPITAL_COMMUNITY)
Admission: EM | Admit: 2011-10-19 | Discharge: 2011-10-19 | Disposition: A | Discharge status: Home / Self Care | Payer: 59 | Attending: Emergency Medicine | Admitting: Emergency Medicine

## 2011-10-19 DIAGNOSIS — I10 Essential (primary) hypertension: Secondary | ICD-10-CM | POA: Insufficient documentation

## 2011-10-19 DIAGNOSIS — E1169 Type 2 diabetes mellitus with other specified complication: Secondary | ICD-10-CM | POA: Insufficient documentation

## 2011-10-19 DIAGNOSIS — E7800 Pure hypercholesterolemia, unspecified: Secondary | ICD-10-CM | POA: Insufficient documentation

## 2011-10-19 DIAGNOSIS — E162 Hypoglycemia, unspecified: Secondary | ICD-10-CM

## 2011-10-19 DIAGNOSIS — Z9641 Presence of insulin pump (external) (internal): Secondary | ICD-10-CM | POA: Insufficient documentation

## 2011-10-19 LAB — GLUCOSE, CAPILLARY: Glucose-Capillary: 259 mg/dL — ABNORMAL HIGH (ref 70–99)
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
## 2011-10-19 NOTE — ED Provider Notes (Signed)
Medical screening examination/treatment/procedure(s) were performed by non-physician practitioner and as supervising physician I was immediately available for consultation/collaboration.   Chastin Garlitz L Verania Salberg, MD 10/19/11 0631 

## 2012-08-23 ENCOUNTER — Encounter (HOSPITAL_COMMUNITY): Payer: Self-pay | Admitting: Emergency Medicine

## 2012-08-23 ENCOUNTER — Inpatient Hospital Stay (HOSPITAL_COMMUNITY)
Admission: EM | Admit: 2012-08-23 | Discharge: 2012-08-25 | DRG: 582 | Disposition: A | Discharge status: Home / Self Care | Payer: BC Managed Care – PPO | Attending: Internal Medicine | Admitting: Internal Medicine

## 2012-08-23 DIAGNOSIS — E860 Dehydration: Secondary | ICD-10-CM | POA: Diagnosis present

## 2012-08-23 DIAGNOSIS — I10 Essential (primary) hypertension: Secondary | ICD-10-CM | POA: Diagnosis present

## 2012-08-23 DIAGNOSIS — Y849 Medical procedure, unspecified as the cause of abnormal reaction of the patient, or of later complication, without mention of misadventure at the time of the procedure: Secondary | ICD-10-CM | POA: Diagnosis present

## 2012-08-23 DIAGNOSIS — F32A Depression, unspecified: Secondary | ICD-10-CM | POA: Diagnosis present

## 2012-08-23 DIAGNOSIS — N289 Disorder of kidney and ureter, unspecified: Secondary | ICD-10-CM

## 2012-08-23 DIAGNOSIS — N179 Acute kidney failure, unspecified: Secondary | ICD-10-CM | POA: Diagnosis present

## 2012-08-23 DIAGNOSIS — Z794 Long term (current) use of insulin: Secondary | ICD-10-CM

## 2012-08-23 DIAGNOSIS — F329 Major depressive disorder, single episode, unspecified: Secondary | ICD-10-CM

## 2012-08-23 DIAGNOSIS — Z9641 Presence of insulin pump (external) (internal): Secondary | ICD-10-CM

## 2012-08-23 DIAGNOSIS — J029 Acute pharyngitis, unspecified: Secondary | ICD-10-CM | POA: Diagnosis present

## 2012-08-23 DIAGNOSIS — T85694A Other mechanical complication of insulin pump, initial encounter: Principal | ICD-10-CM | POA: Diagnosis present

## 2012-08-23 DIAGNOSIS — Y929 Unspecified place or not applicable: Secondary | ICD-10-CM

## 2012-08-23 DIAGNOSIS — E1110 Type 2 diabetes mellitus with ketoacidosis without coma: Secondary | ICD-10-CM | POA: Diagnosis present

## 2012-08-23 DIAGNOSIS — F3289 Other specified depressive episodes: Secondary | ICD-10-CM

## 2012-08-23 DIAGNOSIS — E785 Hyperlipidemia, unspecified: Secondary | ICD-10-CM

## 2012-08-23 DIAGNOSIS — E109 Type 1 diabetes mellitus without complications: Secondary | ICD-10-CM | POA: Diagnosis present

## 2012-08-23 DIAGNOSIS — E1010 Type 1 diabetes mellitus with ketoacidosis without coma: Secondary | ICD-10-CM | POA: Diagnosis present

## 2012-08-23 LAB — URINALYSIS, ROUTINE W REFLEX MICROSCOPIC
Leukocytes, UA: NEGATIVE
Nitrite: NEGATIVE
Specific Gravity, Urine: 1.024 (ref 1.005–1.030)
Urobilinogen, UA: 0.2 mg/dL (ref 0.0–1.0)
pH: 5 (ref 5.0–8.0)

## 2012-08-23 LAB — CBC WITH DIFFERENTIAL/PLATELET
Basophils Relative: 0 % (ref 0–1)
Eosinophils Absolute: 0 10*3/uL (ref 0.0–0.7)
Eosinophils Relative: 0 % (ref 0–5)
Lymphocytes Relative: 7 % — ABNORMAL LOW (ref 12–46)
MCH: 33.3 pg (ref 26.0–34.0)
MCHC: 33.3 g/dL (ref 30.0–36.0)
MCV: 99.8 fL (ref 78.0–100.0)
Monocytes Relative: 4 % (ref 3–12)
Neutro Abs: 16.7 10*3/uL — ABNORMAL HIGH (ref 1.7–7.7)
WBC: 18.7 10*3/uL — ABNORMAL HIGH (ref 4.0–10.5)

## 2012-08-23 LAB — COMPREHENSIVE METABOLIC PANEL
AST: 28 U/L (ref 0–37)
Albumin: 4.1 g/dL (ref 3.5–5.2)
Alkaline Phosphatase: 113 U/L (ref 39–117)
Chloride: 74 mEq/L — ABNORMAL LOW (ref 96–112)
Potassium: 6.6 mEq/L (ref 3.5–5.1)
Sodium: 127 mEq/L — ABNORMAL LOW (ref 135–145)
Total Bilirubin: 0.7 mg/dL (ref 0.3–1.2)
Total Protein: 7.4 g/dL (ref 6.0–8.3)

## 2012-08-23 LAB — POCT I-STAT 3, VENOUS BLOOD GAS (G3P V)
Acid-base deficit: 20 mmol/L — ABNORMAL HIGH (ref 0.0–2.0)
Bicarbonate: 7.4 mEq/L — ABNORMAL LOW (ref 20.0–24.0)
TCO2: 8 mmol/L (ref 0–100)
pO2, Ven: 63 mmHg — ABNORMAL HIGH (ref 30.0–45.0)

## 2012-08-23 LAB — GLUCOSE, CAPILLARY
Glucose-Capillary: >600 mg/dL (ref 70–99)
Glucose-Capillary: >600 mg/dL (ref 70–99)
Glucose-Capillary: >600 mg/dL (ref 70–99)
Glucose-Capillary: 434 mg/dL — ABNORMAL HIGH (ref 70–99)

## 2012-08-23 LAB — BASIC METABOLIC PANEL
CO2: 11 mEq/L — ABNORMAL LOW (ref 19–32)
Chloride: 96 mEq/L (ref 96–112)
Creatinine, Ser: 1.78 mg/dL — ABNORMAL HIGH (ref 0.50–1.35)
GFR calc Af Amer: 51 mL/min — ABNORMAL LOW (ref >90)
Potassium: 4.2 mEq/L (ref 3.5–5.1)

## 2012-08-23 LAB — CG4 I-STAT (LACTIC ACID): Lactic Acid, Venous: 7.03 mmol/L — ABNORMAL HIGH (ref 0.5–2.2)

## 2012-08-23 LAB — CBC
HCT: 35.7 % — ABNORMAL LOW (ref 39.0–52.0)
MCHC: 34.7 g/dL (ref 30.0–36.0)
MCV: 94.9 fL (ref 78.0–100.0)
Platelets: 253 10*3/uL (ref 150–400)
RDW: 12.2 % (ref 11.5–15.5)

## 2012-08-23 LAB — URINE MICROSCOPIC-ADD ON

## 2012-08-23 MED ORDER — SODIUM CHLORIDE 0.9 % IV BOLUS (SEPSIS)
2000.0000 mL | Freq: Once | INTRAVENOUS | Status: AC
  Administered 2012-08-23: 2000 mL via INTRAVENOUS

## 2012-08-23 MED ORDER — PANTOPRAZOLE SODIUM 40 MG IV SOLR
40.0000 mg | INTRAVENOUS | Status: DC
  Administered 2012-08-23: 40 mg via INTRAVENOUS
  Filled 2012-08-23 (×2): qty 40

## 2012-08-23 MED ORDER — DEXTROSE 50 % IV SOLN: 25.0000 mL | INTRAVENOUS | Status: DC | PRN

## 2012-08-23 MED ORDER — SODIUM CHLORIDE 0.9 % IV SOLN: INTRAVENOUS | Status: AC

## 2012-08-23 MED ORDER — SODIUM CHLORIDE 0.9 % IV SOLN
INTRAVENOUS | Status: DC
  Administered 2012-08-24: 6.5 [IU]/h via INTRAVENOUS
  Administered 2012-08-24: 4.5 [IU]/h via INTRAVENOUS
  Filled 2012-08-23: qty 1

## 2012-08-23 MED ORDER — PANTOPRAZOLE SODIUM 40 MG PO TBEC
40.0000 mg | DELAYED_RELEASE_TABLET | Freq: Every day | ORAL | Status: DC
  Filled 2012-08-23: qty 1

## 2012-08-23 MED ORDER — SODIUM CHLORIDE 0.9 % IV SOLN
INTRAVENOUS | Status: DC
  Administered 2012-08-23: 9.3 [IU]/h via INTRAVENOUS
  Filled 2012-08-23: qty 1

## 2012-08-23 MED ORDER — INSULIN REGULAR BOLUS VIA INFUSION
0.0000 [IU] | Freq: Three times a day (TID) | INTRAVENOUS | Status: DC
  Filled 2012-08-23: qty 10

## 2012-08-23 MED ORDER — PANTOPRAZOLE SODIUM 40 MG IV SOLR: 40.0000 mg | INTRAVENOUS | Status: DC

## 2012-08-23 MED ORDER — SODIUM CHLORIDE 0.9 % IV SOLN: INTRAVENOUS | Status: DC

## 2012-08-23 MED ORDER — DEXTROSE-NACL 5-0.45 % IV SOLN: INTRAVENOUS | Status: DC

## 2012-08-23 MED ORDER — DEXTROSE-NACL 5-0.45 % IV SOLN
INTRAVENOUS | Status: DC
  Administered 2012-08-24: 04:00:00 via INTRAVENOUS

## 2012-08-23 MED ORDER — ONDANSETRON HCL 4 MG/2ML IJ SOLN
4.0000 mg | Freq: Once | INTRAMUSCULAR | Status: AC
  Administered 2012-08-23: 4 mg via INTRAVENOUS
  Filled 2012-08-23: qty 2

## 2012-08-23 MED ORDER — SODIUM CHLORIDE 0.9 % IV SOLN
INTRAVENOUS | Status: DC
  Administered 2012-08-23 – 2012-08-24 (×2): via INTRAVENOUS
  Administered 2012-08-24: 75 mL via INTRAVENOUS
  Administered 2012-08-25: via INTRAVENOUS

## 2012-08-23 MED ORDER — HEPARIN SODIUM (PORCINE) 5000 UNIT/ML IJ SOLN
5000.0000 [IU] | Freq: Three times a day (TID) | INTRAMUSCULAR | Status: DC
  Administered 2012-08-23 – 2012-08-25 (×5): 5000 [IU] via SUBCUTANEOUS
  Filled 2012-08-23 (×8): qty 1

## 2012-08-23 NOTE — ED Notes (Signed)
Pt. Stated, Sunday night I ate and then I got sick on my stomach and have not been able to keep anything down since that time.  I'm on an Insulin pump  Last dose of Insulin was 10units at or around 1300.

## 2012-08-23 NOTE — ED Notes (Signed)
CBG Exceeds Measure. Notified Nurse Scarlett.

## 2012-08-23 NOTE — H&P (Signed)
[facility] History and Physical  Jonathan Colon JXB:[id number] DOB: [birth year] DOA: 08/23/2012  Referring physician: Gwyneth Sprout, MD PCP: Elizabeth Palau, FNP   Chief Complaint: DKA  HPI: Jonathan Colon is a 47 y.o. male with history of type 1 diabetes mellitus, hypertension and depression. Patient came in to the hospital because of DKA. 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 Patient started to have nausea, vomiting and abdominal pain. He tried to fix his insulin pump and keep himself hydrated, today he came into the emergency department after his symptoms progressed. Initial evaluation in the emergency department if he has bicarbonate 8, creatinine of 2.1, glucose of 994 and sodium of 127. Patient will be admitted to step down for further management of his DKA. I inspected his insulin pump in the subcutaneous cannula is twisted, patient probably was not getting any insulin.  Review of Systems:  General appearance: alert, cooperative and no distress  Head: Normocephalic, without obvious abnormality, atraumatic  Eyes: conjunctivae/corneas clear. PERRL, EOM's intact. Fundi benign.  Nose: Nares normal. Septum midline. Mucosa normal. No drainage or sinus tenderness.  Throat: lips, mucosa, and tongue normal; teeth and gums normal  Neck: Supple, no masses, no cervical lymphadenopathy, no JVD appreciated, no meningeal signs Resp: clear to auscultation bilaterally  Chest wall: no tenderness  Cardio: regular rate and rhythm, S1, S2 normal, no murmur, click, rub or gallop  GI: Per HPPI Extremities: extremities normal, atraumatic, no cyanosis or edema  Skin: Skin color, texture, turgor normal. No rashes or lesions  Neurologic: Alert and oriented X 3, normal strength and tone. Normal symmetric reflexes. Normal coordination and gait   Past Medical History  Diagnosis Date  . Hypertension    . Hypercholesteremia   . Depression   . Diabetes mellitus     has current insulin pump  . DKA (diabetic ketoacidoses)    Past Surgical History  Procedure Laterality Date  . Eye surgery      19" 32  . Vasectomy     Social History:  reports that he has never smoked. He has never used smokeless tobacco. He reports that  drinks alcohol. He reports that he does not use illicit drugs.  No Known Allergies  No family history on file.   Prior to Admission medications   Medication Sig Start Date End Date Taking? Authorizing Provider  acetaminophen (TYLENOL) 500 MG tablet Take 500 mg by mouth every 6 (six) hours as needed. For pain   Yes Historical Provider, MD  amLODipine (NORVASC) 10 MG tablet Take 10 mg by mouth daily.   Yes Historical Provider, MD  atorvastatin (LIPITOR) 80 MG tablet Take 80 mg by mouth daily.   Yes Historical Provider, MD  buPROPion (WELLBUTRIN XL) 300 MG 24 hr tablet Take 300 mg by mouth daily.   Yes Historical Provider, MD  calcium carbonate (TUMS - DOSED IN MG ELEMENTAL CALCIUM) 500 MG chewable tablet Chew 2 tablets by mouth daily.   Yes Historical Provider, MD  esomeprazole (NEXIUM) 40 MG capsule Take 40 mg by mouth daily before breakfast.   Yes Historical Provider, MD  ibuprofen (ADVIL,MOTRIN) 800 MG tablet Take 800 mg by mouth every 8 (eight) hours as needed. For pain   Yes Historical Provider, MD  Insulin Aspart (NOVOLOG [place]) Inject into the skin. Sliding scale; insulin pump   Yes Historical Provider, MD  PARoxetine HCl (PAXIL PO) Take 40 mg  by mouth daily.    Yes Historical Provider, MD   Physical Exam: Filed Vitals:   08/23/12 1452  BP: 127/53  Pulse: 116  Temp: 97.9 F (36.6 C)  TempSrc: Oral  Resp: 20  SpO2: 100%  General appearance: alert, cooperative and no distress  Head: Normocephalic, without obvious abnormality, atraumatic  Eyes: conjunctivae/corneas clear. PERRL, EOM's intact. Fundi benign.  Nose: Nares normal. Septum midline. Mucosa normal. No  drainage or sinus tenderness.  Throat: lips, mucosa, and tongue normal; teeth and gums normal  Neck: Supple, no masses, no cervical lymphadenopathy, no JVD appreciated, no meningeal signs Resp: clear to auscultation bilaterally  Chest wall: no tenderness  Cardio: regular rate and rhythm, S1, S2 normal, no murmur, click, rub or gallop  GI: soft, non-tender; bowel sounds normal; no masses, no organomegaly  Extremities: extremities normal, atraumatic, no cyanosis or edema  Skin: Skin color, texture, turgor normal. No rashes or lesions  Neurologic: Alert and oriented X 3, normal strength and tone. Normal symmetric reflexes. Normal coordination and gait   Labs on Admission:  Basic Metabolic Panel:  Recent Labs Lab 08/23/12 1535  NA 127*  K 6.6*  CL 74*  CO2 8*  GLUCOSE 994*  BUN 40*  CREATININE 2.15*  CALCIUM 10.6*   Liver Function Tests:  Recent Labs Lab 08/23/12 1535  AST 28  ALT 68*  ALKPHOS 113  BILITOT 0.7  PROT 7.4  ALBUMIN 4.1   No results found for this basename: LIPASE, AMYLASE,  in the last 168 hours No results found for this basename: AMMONIA,  in the last 168 hours CBC:  Recent Labs Lab 08/23/12 1535  WBC 18.7*  NEUTROABS 16.7*  HGB 14.7  HCT 44.1  MCV 99.8  PLT 310   Cardiac Enzymes: No results found for this basename: CKTOTAL, CKMB, CKMBINDEX, TROPONINI,  in the last 168 hours  BNP (last 3 results) No results found for this basename: PROBNP,  in the last 8760 hours CBG:  Recent Labs Lab 08/23/12 1456  GLUCAP >600*    Radiological Exams on Admission: No results found.  EKG: Independently reviewed.   Assessment/Plan Principal Problem:   DKA (diabetic ketoacidoses) Active Problems:   DM type 1 (diabetes mellitus, type 1)   HTN (hypertension)   Depression   ARF (acute renal failure)   DKA -Patient started on IV insulin protocol, will utilize glucose stabilized protocol. -His BMP will be checked every 2 hours, then every 4  hours. -Aggressive hydration with IV fluids 3 L of normal saline will be given wide open, and then rate of 150. -Replete potassium and magnesium according to the levels.  Acute renal failure/dehydration -Secondary to osmotic diuresis from hyperglycemia. -Patient will get bolus of 3 L of normal saline, then rate of 150 mL per hour of IV fluids. -Check BMP  Lactic acidosis -Secondary to DKA, check lactate in a.m.  Diabetes mellitus type 1 -Patient wears insulin pump, he said usually his blood sugar is okay. -His subcutaneous cannula is kinked. -Check hemoglobin A1c  Code Status: Full code Family Communication: Plan discussed with the patient Disposition Plan: Stepdown, inpatient, anticipate length of stay to be greater than 2 midnights  Time spent: 70 minutes  [facility] A [facility] Pager [phone]  If 7PM-7AM, please contact night-coverage [email or web address] Password [facility] 08/23/2012, 6:19 PM

## 2012-08-23 NOTE — ED Notes (Signed)
Blood sugar too high for reading.

## 2012-08-23 NOTE — ED Provider Notes (Addendum)
CSN: [id number]     Arrival date & time 08/23/12  1440 History     First MD Initiated Contact with Patient 08/23/12 1505     Chief Complaint  Patient presents with  . Hyperglycemia   (Consider location/radiation/quality/duration/timing/severity/associated sxs/prior Treatment) HPI Comments: Approximately 2 days ago patient started to feel nauseated and intermittent vomiting. Now he is unable to hold anything down and his blood sugar has been elevated. He changed his insulin pump site on Monday hoping that would improve his nausea and vomiting continues. He denies any fever, productive cough, abdominal pain. He states he's had mild diarrhea. No recent medication changes however he has been unable to check his blood sugar because his new insurance has not cleared the test strips yet  The history is provided by the patient.    Past Medical History  Diagnosis Date  . Hypertension   . Hypercholesteremia   . Depression   . Diabetes mellitus     has current insulin pump  . DKA (diabetic ketoacidoses)    Past Surgical History  Procedure Laterality Date  . Eye surgery      1992  . Vasectomy     No family history on file. History  Substance Use Topics  . Smoking status: Never Smoker   . Smokeless tobacco: Never Used  . Alcohol Use: Yes    Review of Systems  Constitutional: Positive for chills and fatigue. Negative for fever.  Respiratory: Negative for cough and shortness of breath.   Cardiovascular: Negative for chest pain and leg swelling.  Gastrointestinal: Positive for nausea, vomiting and diarrhea. Negative for abdominal pain and blood in stool.  Genitourinary: Negative for dysuria.  All other systems reviewed and are negative.    Allergies  Review of patient's allergies indicates no known allergies.  Home Medications   Current Outpatient Rx  Name  Route  Sig  Dispense  Refill  . acetaminophen (TYLENOL) 500 MG tablet   Oral   Take 500 mg by mouth every 6 (six)  hours as needed. For pain         . amLODipine (NORVASC) 10 MG tablet   Oral   Take 10 mg by mouth daily.         Marland Kitchen atorvastatin (LIPITOR) 80 MG tablet   Oral   Take 80 mg by mouth daily.         Marland Kitchen buPROPion (WELLBUTRIN XL) 300 MG 24 hr tablet   Oral   Take 300 mg by mouth daily.         Marland Kitchen ibuprofen (ADVIL,MOTRIN) 800 MG tablet   Oral   Take 800 mg by mouth every 8 (eight) hours as needed. For pain         . Insulin Aspart (NOVOLOG [place])   Subcutaneous   Inject into the skin. Sliding scale; insulin pump         . PARoxetine HCl (PAXIL PO)   Oral   Take 40 mg by mouth daily.           BP 127/53  Pulse 116  Temp(Src) 97.9 F (36.6 C) (Oral)  Resp 20  SpO2 100% Physical Exam  Nursing note and vitals reviewed. Constitutional: He is oriented to person, place, and time. He appears well-developed and well-nourished. No distress.  Smells of ketones  HENT:  Head: Normocephalic and atraumatic.  Mouth/Throat: Oropharynx is clear and moist. Mucous membranes are dry.  Eyes: Conjunctivae and EOM are normal. Pupils are equal, round, and reactive to  light.  Neck: Normal range of motion. Neck supple.  Cardiovascular: Regular rhythm and intact distal pulses.  Tachycardia present.   No murmur heard. Pulmonary/Chest: Effort normal and breath sounds normal. No respiratory distress. He has no wheezes. He has no rales.  Abdominal: Soft. He exhibits no distension. There is no tenderness. There is no rebound and no guarding.  Musculoskeletal: Normal range of motion. He exhibits no edema and no tenderness.  Neurological: He is alert and oriented to person, place, and time.  Skin: Skin is warm and dry. No rash noted. No erythema.  Psychiatric: He has a normal mood and affect. His behavior is normal.    ED Course   Procedures (including critical care time)  Labs Reviewed  GLUCOSE, CAPILLARY - Abnormal; Notable for the following:    Glucose-Capillary >600 (*)    All other  components within normal limits  CBC WITH DIFFERENTIAL - Abnormal; Notable for the following:    WBC 18.7 (*)    Neutrophils Relative % 89 (*)    Lymphocytes Relative 7 (*)    Neutro Abs 16.7 (*)    All other components within normal limits  URINALYSIS, ROUTINE W REFLEX MICROSCOPIC - Abnormal; Notable for the following:    Glucose, UA >1000 (*)    Ketones, ur >80 (*)    Protein, ur 30 (*)    All other components within normal limits  COMPREHENSIVE METABOLIC PANEL - Abnormal; Notable for the following:    Sodium 127 (*)    Potassium 6.6 (*)    Chloride 74 (*)    CO2 8 (*)    Glucose, Bld 994 (*)    BUN 40 (*)    Creatinine, Ser 2.15 (*)    Calcium 10.6 (*)    ALT 68 (*)    GFR calc non Af Amer 35 (*)    GFR calc Af Amer 41 (*)    All other components within normal limits  CG4 I-STAT (LACTIC ACID) - Abnormal; Notable for the following:    Lactic Acid, Venous 7.03 (*)    All other components within normal limits  POCT I-STAT 3, BLOOD GAS (G3P V) - Abnormal; Notable for the following:    pH, Ven 7.116 (*)    pCO2, Ven 22.9 (*)    pO2, Ven 63.0 (*)    Bicarbonate 7.4 (*)    Acid-base deficit 20.0 (*)    All other components within normal limits  URINE MICROSCOPIC-ADD ON   No results found. CRITICAL CARE Performed by: Gwyneth Sprout Total critical care time: 45 Critical care time was exclusive of separately billable procedures and treating other patients. Critical care was necessary to treat or prevent imminent or life-threatening deterioration. Critical care was time spent personally by me on the following activities: development of treatment plan with patient and/or surrogate as well as nursing, discussions with consultants, evaluation of patient's response to treatment, examination of patient, obtaining history from patient or surrogate, ordering and performing treatments and interventions, ordering and review of laboratory studies, ordering and review of radiographic  studies, pulse oximetry and re-evaluation of patient's condition.  Date: 08/23/2012  Rate: 118  Rhythm: normal sinus rhythm  QRS Axis: normal  Intervals: normal  ST/T Wave abnormalities: normal  Conduction Disutrbances:none  Narrative Interpretation:   Old EKG Reviewed: none available   1. DKA (diabetic ketoacidoses)   2. Acute renal insufficiency     MDM   Patient history of insulin-dependent diabetes who is currently on a pump who started having  nausea and vomiting on Sunday which has persisted. He has run out of test strips because he is waiting for his insurance and so does not know what his blood sugar has been running. He denies any infectious symptoms and has no abdominal pain. Patient appears dehydrated and spells of ketones. Mildly tachycardic but normal blood pressure. Feel patient is in mild DKA. Blood sugar here is greater than 600. Patient started on IV fluids and Zofran. CBC, CMP, UA, VBG pending.  4:18 PM Pt with labs consistent with DKA with lactate of 7 and vbg with metabolic acidosis with pH of 7.11 and HCO3 of 7.  Will hydrate with 4L and then start glucostabilizer.  5:13 PM BMP with elevated potassium to 6.6 and new acute renal insufficency most likely due to dehydration. EKG without peaked t-waves and will start insulin.    Gwyneth Sprout, MD 08/23/12 1745  Gwyneth Sprout, MD 08/23/12 1610

## 2012-08-24 DIAGNOSIS — I10 Essential (primary) hypertension: Secondary | ICD-10-CM

## 2012-08-24 DIAGNOSIS — N179 Acute kidney failure, unspecified: Secondary | ICD-10-CM

## 2012-08-24 LAB — LACTIC ACID, PLASMA: Lactic Acid, Venous: 1.4 mmol/L (ref 0.5–2.2)

## 2012-08-24 LAB — GLUCOSE, CAPILLARY
Glucose-Capillary: 139 mg/dL — ABNORMAL HIGH (ref 70–99)
Glucose-Capillary: 145 mg/dL — ABNORMAL HIGH (ref 70–99)
Glucose-Capillary: 191 mg/dL — ABNORMAL HIGH (ref 70–99)
Glucose-Capillary: 277 mg/dL — ABNORMAL HIGH (ref 70–99)
Glucose-Capillary: 390 mg/dL — ABNORMAL HIGH (ref 70–99)

## 2012-08-24 LAB — BASIC METABOLIC PANEL
BUN: 33 mg/dL — ABNORMAL HIGH (ref 6–23)
CO2: 17 mEq/L — ABNORMAL LOW (ref 19–32)
CO2: 26 mEq/L (ref 19–32)
Calcium: 8.6 mg/dL (ref 8.4–10.5)
Chloride: 100 mEq/L (ref 96–112)
Chloride: 105 mEq/L (ref 96–112)
Creatinine, Ser: 1.57 mg/dL — ABNORMAL HIGH (ref 0.50–1.35)
GFR calc Af Amer: 60 mL/min — ABNORMAL LOW (ref >90)
Glucose, Bld: 248 mg/dL — ABNORMAL HIGH (ref 70–99)
Glucose, Bld: 398 mg/dL — ABNORMAL HIGH (ref 70–99)
Potassium: 4.1 mEq/L (ref 3.5–5.1)
Potassium: 4.3 mEq/L (ref 3.5–5.1)
Sodium: 136 mEq/L (ref 135–145)

## 2012-08-24 LAB — BASIC METABOLIC PANEL WITH GFR
BUN: 34 mg/dL — ABNORMAL HIGH (ref 6–23)
CO2: 20 meq/L (ref 19–32)
Calcium: 8.6 mg/dL (ref 8.4–10.5)
Chloride: 103 meq/L (ref 96–112)
Creatinine, Ser: 1.55 mg/dL — ABNORMAL HIGH (ref 0.50–1.35)
GFR calc Af Amer: 61 mL/min — ABNORMAL LOW
GFR calc non Af Amer: 52 mL/min — ABNORMAL LOW
Glucose, Bld: 331 mg/dL — ABNORMAL HIGH (ref 70–99)
Potassium: 4 meq/L (ref 3.5–5.1)
Sodium: 136 meq/L (ref 135–145)

## 2012-08-24 MED ORDER — INSULIN ASPART 100 UNIT/ML ~~LOC~~ SOLN
0.0000 [IU] | Freq: Three times a day (TID) | SUBCUTANEOUS | Status: DC
  Administered 2012-08-24: 3 [IU] via SUBCUTANEOUS
  Administered 2012-08-24: 2 [IU] via SUBCUTANEOUS
  Administered 2012-08-25: 8 [IU] via SUBCUTANEOUS
  Administered 2012-08-25: 11 [IU] via SUBCUTANEOUS

## 2012-08-24 MED ORDER — MUPIROCIN 2 % EX OINT
1.0000 "application " | TOPICAL_OINTMENT | Freq: Two times a day (BID) | CUTANEOUS | Status: DC
  Administered 2012-08-24 – 2012-08-25 (×2): 1 via NASAL
  Filled 2012-08-24 (×3): qty 22

## 2012-08-24 MED ORDER — INSULIN GLARGINE 100 UNIT/ML ~~LOC~~ SOLN
15.0000 [IU] | Freq: Every day | SUBCUTANEOUS | Status: DC
  Administered 2012-08-24: 15 [IU] via SUBCUTANEOUS
  Filled 2012-08-24: qty 0.15

## 2012-08-24 MED ORDER — DEXTROSE 50 % IV SOLN: 25.0000 mL | Freq: Once | INTRAVENOUS | Status: AC | PRN

## 2012-08-24 MED ORDER — DEXTROSE 50 % IV SOLN: 50.0000 mL | Freq: Once | INTRAVENOUS | Status: DC | PRN

## 2012-08-24 MED ORDER — INSULIN DETEMIR 100 UNIT/ML ~~LOC~~ SOLN: 15.0000 [IU] | Freq: Every day | SUBCUTANEOUS | Status: DC

## 2012-08-24 MED ORDER — BUPROPION HCL ER (XL) 300 MG PO TB24: 300.0000 mg | ORAL_TABLET | Freq: Every day | ORAL | Status: DC

## 2012-08-24 MED ORDER — MENTHOL 3 MG MT LOZG
1.0000 | LOZENGE | OROMUCOSAL | Status: DC | PRN
  Administered 2012-08-24: 3 mg via ORAL
  Filled 2012-08-24: qty 9

## 2012-08-24 MED ORDER — AMLODIPINE BESYLATE 10 MG PO TABS
10.0000 mg | ORAL_TABLET | Freq: Every day | ORAL | Status: DC
  Administered 2012-08-24 – 2012-08-25 (×2): 10 mg via ORAL
  Filled 2012-08-24 (×2): qty 1

## 2012-08-24 MED ORDER — PANTOPRAZOLE SODIUM 40 MG IV SOLR
40.0000 mg | Freq: Every day | INTRAVENOUS | Status: DC
  Filled 2012-08-24: qty 40

## 2012-08-24 MED ORDER — CALCIUM CARBONATE ANTACID 500 MG PO CHEW
1.0000 | CHEWABLE_TABLET | Freq: Three times a day (TID) | ORAL | Status: DC | PRN
  Filled 2012-08-24: qty 1

## 2012-08-24 MED ORDER — GLUCOSE 40 % PO GEL: 1.0000 | ORAL | Status: DC | PRN

## 2012-08-24 MED ORDER — ONDANSETRON HCL 4 MG/2ML IJ SOLN
4.0000 mg | Freq: Four times a day (QID) | INTRAMUSCULAR | Status: DC | PRN
  Administered 2012-08-24 (×2): 4 mg via INTRAVENOUS
  Filled 2012-08-24 (×2): qty 2

## 2012-08-24 MED ORDER — CHLORHEXIDINE GLUCONATE CLOTH 2 % EX PADS: 6.0000 | MEDICATED_PAD | Freq: Every day | CUTANEOUS | Status: DC

## 2012-08-24 NOTE — Progress Notes (Signed)
Pt here for DKA on gluco-stablizer CBG checks reading "High". On call MD notified stat labs ordered. Lab called for results, nurse informed that multiple lab orders cancelled the stat order and was thus credited and not processed, on call MD informed. CBG checked again an hour after the first on and it still registered as "High". Labs drawn again, awaiting final results.

## 2012-08-24 NOTE — Progress Notes (Signed)
Inpatient Diabetes Program Recommendations  AACE/ADA: New Consensus Statement on Inpatient Glycemic Control (2013)  Target Ranges:  Prepandial:   less than 140 mg/dL      Peak postprandial:   less than 180 mg/dL (1-2 hours)      Critically ill patients:  140 - 180 mg/dL   Pt with insulin pump therapy admitted with DKA Spoke with patient regarding running out of supplies for his insulin pump. He states he had changed insurance companies and when his insulin and cbg meter supplies were ordered, he thinks they were ordered through his prior insurance supply pharmacy and supply warehouse.  He states he had to wait 3 months for his new insurance to cover his glucose control supplies.  He states he will call the proper channels to get his insulin and meter strips. Requested that he could review his pump basal settings with me, and he stated he did not 'think his pump settings or his pump was any of my business.'  I attempted to explain that I only wanted to assure that he would be getting the amount of basal insulin that he is used to taking with his pump.  However, he repeatedly told me that it was none of my business. I re-emphasized that I did not need to lay hands on his pump if he could just tell me what his usual settings were.  He then stated to me, "you are pushy and I don't like pushy people."  I apologized that I did not intend to be pushy. He then looked at his pump and after several attempts to find his basal rates, he told me he takes about 13 units a day for basal and sometimes his total basal and bolus totals to 25 units. I did not pursue any bolus calculations for meal  correction that he might do.use in is pump. Thanked him and offered any assistance i could be to him.  (However, I don't think he will need/want my assistance) He requested that he get his throat lozenge and some warm liquids.  I requested to his nurse. Thank you, Lenor Coffin, RN, Diabetes CNS [phone])

## 2012-08-24 NOTE — Progress Notes (Signed)
Pt refused CHG bath, requesting it be done at a later time.

## 2012-08-24 NOTE — Progress Notes (Signed)
Utilization Review Completed.  

## 2012-08-24 NOTE — Progress Notes (Addendum)
[facility] Progress Note [place] TEAM 1 - Stepdown/ICU TEAM   ELIYAH BAZZI WUJ:[id number] DOB: [birth year] DOA: 08/23/2012 PCP: Elizabeth Palau, FNP  Brief narrative: This is a 47 year old type I diabetic with an insulin pump who presented to the ER with nausea vomiting and abdominal pain. His pump was noted to be malfunctioning -the subcutaneous cannula was twisted. He was noted to be in DKA with acute renal failure. He was admitted and placed on an insulin infusion and hydrated aggressively.  Assessment/Plan: Principal Problem:   DKA (diabetic ketoacidoses)/ DM type 1 (diabetes mellitus, type 1) -Resolved overnight -Patient transitioned to Lantus this morning at 7 AM and therefore we are unable to resume his insulin pump today -Placed orders to resume insulin pump tomorrow morning at 7 AM-he has brought his supplies with him  Active Problems: Nausea/vomiting -Has resolved  -Have ordered when necessary Zofran in case it recurs once he starts to eat  Severe sore throat -Likely due to vomiting -Cepacol ordered and advised not to drink cold liquids     HTN (hypertension) -Resume Norvasc today    Depression -Patient states he's weaned self off of the Wellbutrin -He takes Paxil but since he does not know his dosage I will not order it for him -He states that he was placed on them when his wife passed away; he is doing better now which is why he is trying to wean himself off    ARF (acute renal failure) -Continue to hydrate, manage sugars and repeat the metabolic panel in the morning    Code Status: Full code Family Communication: None Disposition Plan: Transfer to med surge and likely home tomorrow  Consultants: None  Procedures: None  Antibiotics: None  DVT prophylaxis: SCDs  HPI/Subjective: Patient states that he has not tried the ED this morning as he is afraid of vomiting again also his throat is severely sore. He has no other  complaints.   Objective: Blood pressure 152/50, pulse 103, temperature 98.3 F (36.8 C), temperature source Oral, resp. rate 16, height 5\' 8"  (1.727 m), weight 70.3 kg (154 lb 15.7 oz), SpO2 100.00%.  Intake/Output Summary (Last 24 hours) at 08/24/12 1109 Last data filed at 08/24/12 0729  Gross per 24 hour  Intake 18188.26 ml  Output    350 ml  Net 17838.26 ml     Exam: General: No acute respiratory distress Lungs: Clear to auscultation bilaterally without wheezes or crackles Cardiovascular: Regular rate and rhythm without murmur gallop or rub normal S1 and S2 Abdomen: Nontender, nondistended, soft, bowel sounds positive, no rebound, no ascites, no appreciable mass Extremities: No significant cyanosis, clubbing, or edema bilateral lower extremities  Data Reviewed: Basic Metabolic Panel:  Recent Labs Lab 08/23/12 1535 08/23/12 2146 08/24/12 0001 08/24/12 0150 08/24/12 0400  NA 127* 135 136 136 138  K 6.6* 4.2 4.3 4.0 4.1  CL 74* 96 100 103 105  CO2 8* 11* 17* 20 26  GLUCOSE 994* 529* 398* 331* 248*  BUN 40* 39* 35* 34* 33*  CREATININE 2.15* 1.78* 1.64* 1.55* 1.57*  CALCIUM 10.6* 8.7 8.6 8.6 8.8   Liver Function Tests:  Recent Labs Lab 08/23/12 1535  AST 28  ALT 68*  ALKPHOS 113  BILITOT 0.7  PROT 7.4  ALBUMIN 4.1   No results found for this basename: LIPASE, AMYLASE,  in the last 168 hours No results found for this basename: AMMONIA,  in the last 168 hours CBC:  Recent Labs Lab 08/23/12 1535 08/23/12 2100  WBC  18.7* 15.8*  NEUTROABS 16.7*  --   HGB 14.7 12.4*  HCT 44.1 35.7*  MCV 99.8 94.9  PLT 310 253   Cardiac Enzymes: No results found for this basename: CKTOTAL, CKMB, CKMBINDEX, TROPONINI,  in the last 168 hours BNP (last 3 results) No results found for this basename: PROBNP,  in the last 8760 hours CBG:  Recent Labs Lab 08/24/12 0336 08/24/12 0440 08/24/12 0555 08/24/12 0640 08/24/12 0805  GLUCAP 220* 229* 172* 145* 107*     Recent Results (from the past 240 hour(s))  MRSA PCR SCREENING     Status: Abnormal   Collection Time    08/23/12  7:47 PM      Result Value Range Status   MRSA by PCR POSITIVE (*) NEGATIVE Final   Comment:            The GeneXpert MRSA Assay (FDA     approved for NASAL specimens     only), is one component of a     comprehensive MRSA colonization     surveillance program. It is not     intended to diagnose MRSA     infection nor to guide or     monitor treatment for     MRSA infections.     RESULT CALLED TO, READ BACK BY AND VERIFIED WITH:     Cassandria Anger RN [id number] 08/23/12 A BROWNING     Studies:  Recent x-ray studies have been reviewed in detail by the Attending Physician  Scheduled Meds:  Scheduled Meds: . amLODipine  10 mg Oral Daily  . Chlorhexidine Gluconate Cloth  6 each Topical Q0600  . heparin  5,000 Units Subcutaneous Q8H  . insulin aspart  0-15 Units Subcutaneous TID WC  . mupirocin ointment  1 application Nasal BID  . [START ON 08/25/2012] pantoprazole (PROTONIX) IV  40 mg Intravenous Daily   Continuous Infusions: . sodium chloride 150 mL/hr at 08/24/12 0503    Time spent on care of this patient: 35 minutes   Shaul Trautman,MD  [facility] Office  [phone] Pager - Text Page per Loretha Stapler as per below:  On-Call/Text Page:      [email or web address]      password [email or web address]  If 7PM-7AM, please contact night-coverage [email or web address] Password [email or web address] 08/24/2012, 11:09 AM   LOS: 1 day

## 2012-08-25 LAB — BASIC METABOLIC PANEL
CO2: 21 mEq/L (ref 19–32)
Chloride: 100 mEq/L (ref 96–112)
Glucose, Bld: 344 mg/dL — ABNORMAL HIGH (ref 70–99)
Potassium: 3.7 mEq/L (ref 3.5–5.1)
Sodium: 135 mEq/L (ref 135–145)

## 2012-08-25 LAB — GLUCOSE, CAPILLARY
Glucose-Capillary: 350 mg/dL — ABNORMAL HIGH (ref 70–99)
Glucose-Capillary: 256 mg/dL — ABNORMAL HIGH (ref 70–99)

## 2012-08-25 MED ORDER — DIPHENHYDRAMINE HCL 25 MG PO CAPS: 25.0000 mg | ORAL_CAPSULE | Freq: Once | ORAL | Status: DC

## 2012-08-25 MED ORDER — PANTOPRAZOLE SODIUM 40 MG PO TBEC
40.0000 mg | DELAYED_RELEASE_TABLET | Freq: Every day | ORAL | Status: DC
  Administered 2012-08-25: 40 mg via ORAL
  Filled 2012-08-25: qty 1

## 2012-08-25 NOTE — Progress Notes (Signed)
Inpatient Diabetes Program Recommendations  AACE/ADA: New Consensus Statement on Inpatient Glycemic Control (2013)  Target Ranges:  Prepandial:   less than 140 mg/dL      Peak postprandial:   less than 180 mg/dL (1-2 hours)      Critically ill patients:  140 - 180 mg/dL     Results for Jonathan Colon, Jonathan Colon (MRN [id number]) as of 08/25/2012 12:42  Ref. Range 08/25/2012 07:50 08/25/2012 10:44 08/25/2012 11:58  Glucose-Capillary Latest Range: 70-99 mg/dL 469 (H) 629 (H) 528 (H)    Went in to speak with patient regarding his insulin pump.  Patient told me he thinks his fasting sugar was elevated this morning b/c he ate dinner very late last night and did not get any Novolog coverage for his food.  Patient told me he restarted his insulin pump this morning around 9am with all new, fresh supplies.  Patient stated he thinks his insulin pump insertion site might be bad.  Stated he has had many problems with this particular brand of insertion site and plans to call the pump manufacturer when he gets home.  Asked patient if he would like for me to call the MD and get an order for SQ Lantus for him before he is discharged.  Patient stated he would prefer to get some Novolog SQ and that he would change his site out again as soon as he gets home.  Patient told me he would call his PCP and get a RX for Lantus if needed.  Patient also told me he has syringes at home if needs to give himself SQ Novolog with injection.  Patient also told me he has an inserter at home that will help him place a new insertion site.  Patient is very knowledgeable about his diabetes and told me he normally keeps his A1c around 6.5%.    RN to give patient a dose of SSI Novolog per the ordered SSI before d/c.  Reminded patient to please call his PCP (physician with [facility] practice) if he continues to have pump issues and needs a Rx for basal insulin.   Will follow. Ambrose Finland RN, MSN, CDE Diabetes Coordinator Inpatient Diabetes  Program [phone]

## 2012-08-25 NOTE — Progress Notes (Deleted)
Physician Discharge Summary  ADONI GREENOUGH ZOX:[id number] DOB: February 24, 1965 DOA: 08/23/2012  PCP: Elizabeth Palau, FNP  Admit date: 08/23/2012 Discharge date: 08/25/2012  Time spent: >45 minutes    Discharge Diagnoses:  Principal Problem:   DKA (diabetic ketoacidoses) Active Problems:   DM type 1 (diabetes mellitus, type 1)   HTN (hypertension)   Depression   ARF (acute renal failure)   Discharge Condition: stable  Diet recommendation: diabetic diet-   Filed Weights   08/23/12 1900 08/24/12 2055  Weight: 70.3 kg (154 lb 15.7 oz) 70.8 kg (156 lb 1.4 oz)    History of present illness:  This is a 47 year old type I diabetic with an insulin pump who presented to the ER with nausea vomiting and abdominal pain. His pump was noted to be malfunctioning -the subcutaneous cannula was twisted. He was noted to be in DKA with acute renal failure. He was admitted and placed on an insulin infusion and hydrated aggressively.    Hospital Course:   DKA (diabetic ketoacidoses)/ DM type 1 (diabetes mellitus, type 1)  -Resolved overnight  -Patient transitioned to Lantus this morning at 7 AM and therefore we are unable to resume his insulin pump on 7/30 -Pt resumed pump this AM- but please note that pt decided to have orange and apple juice this AM and ate a late dinner last night resulting in elevated sugars today (200-300) - pump is working fine- will d/c pt today  Active Problems:  Nausea/vomiting  -Has resolved   Severe sore throat  -Likely due to vomiting  -Cepacol ordered and advised not to drink cold liquids   HTN (hypertension)  -Resumed Norvasc  Depression  -Patient states he's weaned self off of the Wellbutrin  -He takes Paxil but since he does not know his dosage I will not order it for him  -He states that he was placed on them when his wife passed away; he is doing better now which is why he is trying to wean himself off   ARF (acute renal failure)  -resolved with  hydration      Procedures:  none (i.e. Studies not automatically included, echos, thoracentesis, etc; not x-rays)  Consultations:  none  Discharge Exam: Filed Vitals:   08/24/12 1800 08/24/12 2055 08/25/12 0534 08/25/12 0916  BP: 148/77 147/71 150/77 145/73  Pulse: 63 109 99 111  Temp: 97.9 F (36.6 C) 98.5 F (36.9 C) 97.9 F (36.6 C) 98.6 F (37 C)  TempSrc: Oral Oral Oral   Resp: 20 18 18 18   Height:  5' 8.5" (1.74 m)    Weight:  70.8 kg (156 lb 1.4 oz)    SpO2: 97% 98% 97% 98%    General: AAO x 3 Cardiovascular: RRR, no murmurs Respiratory: CTA b/l   Discharge Instructions  Discharge Orders   Future Orders Complete By Expires     Diet - low sodium heart healthy  As directed     Scheduling Instructions:      Low fat and diabetic    Diet - low sodium heart healthy  As directed     Comments:      And diabetic diet    Increase activity slowly  As directed     Increase activity slowly  As directed         Medication List    STOP taking these medications       WELLBUTRIN XL 300 MG 24 hr tablet  Generic drug:  buPROPion      TAKE these  medications       acetaminophen 500 MG tablet  Commonly known as:  TYLENOL  Take 500 mg by mouth every 6 (six) hours as needed. For pain     amLODipine 10 MG tablet  Commonly known as:  NORVASC  Take 10 mg by mouth daily.     atorvastatin 80 MG tablet  Commonly known as:  LIPITOR  Take 80 mg by mouth daily.     calcium carbonate 500 MG chewable tablet  Commonly known as:  TUMS - dosed in mg elemental calcium  Chew 2 tablets by mouth daily.     esomeprazole 40 MG capsule  Commonly known as:  NEXIUM  Take 40 mg by mouth daily before breakfast.     ibuprofen 800 MG tablet  Commonly known as:  ADVIL,MOTRIN  Take 800 mg by mouth every 8 (eight) hours as needed. For pain     NOVOLOG [place]  Inject into the skin. Sliding scale; insulin pump     PAXIL PO  Take 40 mg by mouth daily.       No Known  Allergies    The results of significant diagnostics from this hospitalization (including imaging, microbiology, ancillary and laboratory) are listed below for reference.    Significant Diagnostic Studies: No results found.  Microbiology: Recent Results (from the past 240 hour(s))  MRSA PCR SCREENING     Status: Abnormal   Collection Time    08/23/12  7:47 PM      Result Value Range Status   MRSA by PCR POSITIVE (*) NEGATIVE Final   Comment:            The GeneXpert MRSA Assay (FDA     approved for NASAL specimens     only), is one component of a     comprehensive MRSA colonization     surveillance program. It is not     intended to diagnose MRSA     infection nor to guide or     monitor treatment for     MRSA infections.     RESULT CALLED TO, READ BACK BY AND VERIFIED WITH:     E OFORI RN 2246 08/23/12 A BROWNING     Labs: Basic Metabolic Panel:  Recent Labs Lab 08/23/12 2146 08/24/12 0001 08/24/12 0150 08/24/12 0400 08/25/12 0540  NA 135 136 136 138 135  K 4.2 4.3 4.0 4.1 3.7  CL 96 100 103 105 100  CO2 11* 17* 20 26 21   GLUCOSE 529* 398* 331* 248* 344*  BUN 39* 35* 34* 33* 18  CREATININE 1.78* 1.64* 1.55* 1.57* 1.02  CALCIUM 8.7 8.6 8.6 8.8 8.4   Liver Function Tests:  Recent Labs Lab 08/23/12 1535  AST 28  ALT 68*  ALKPHOS 113  BILITOT 0.7  PROT 7.4  ALBUMIN 4.1   No results found for this basename: LIPASE, AMYLASE,  in the last 168 hours No results found for this basename: AMMONIA,  in the last 168 hours CBC:  Recent Labs Lab 08/23/12 1535 08/23/12 2100  WBC 18.7* 15.8*  NEUTROABS 16.7*  --   HGB 14.7 12.4*  HCT 44.1 35.7*  MCV 99.8 94.9  PLT 310 253   Cardiac Enzymes: No results found for this basename: CKTOTAL, CKMB, CKMBINDEX, TROPONINI,  in the last 168 hours BNP: BNP (last 3 results) No results found for this basename: PROBNP,  in the last 8760 hours CBG:  Recent Labs Lab 08/24/12 1656 08/24/12 2053 08/25/12 0750  08/25/12 1044  08/25/12 1158  GLUCAP 191* 96 350* 256* 266*       Signed:  Rande Dario  [facility] 08/25/2012, 4:57 PM

## 2012-09-02 NOTE — Discharge Summary (Addendum)
Physician Discharge Summary  Jonathan Colon YNW:[id number] DOB: 1965/01/31 DOA: 08/23/2012  PCP: Elizabeth Palau, FNP  Admit date: 08/23/2012 Discharge date: 09/02/2012  Time spent: >45 minutes    Discharge Diagnoses:  Principal Problem:   DKA (diabetic ketoacidoses) Active Problems:   DM type 1 (diabetes mellitus, type 1)   HTN (hypertension)   Depression   ARF (acute renal failure)   Discharge Condition: stable  Diet recommendation: diabetic diet-   Filed Weights   08/23/12 1900 08/24/12 2055  Weight: 70.3 kg (154 lb 15.7 oz) 70.8 kg (156 lb 1.4 oz)    History of present illness:  This is a 47 year old type I diabetic with an insulin pump who presented to the ER with nausea vomiting and abdominal pain. His pump was noted to be malfunctioning -the subcutaneous cannula was twisted. He was noted to be in DKA with acute renal failure. He was admitted and placed on an insulin infusion and hydrated aggressively.    Hospital Course:   DKA (diabetic ketoacidoses)/ DM type 1 (diabetes mellitus, type 1)  -Resolved overnight  -Patient transitioned to Lantus this morning at 7 AM and therefore we were unable to resume his insulin pump on 7/30 -Pt resumed pump this AM- but please note that pt decided to have orange and apple juice this AM and ate a late dinner last night resulting in elevated sugars today (200-300) - pump is working fine- will d/c pt today  Active Problems:  Nausea/vomiting  -Has resolved   Severe sore throat  -Likely due to vomiting  -Cepacol ordered and advised not to drink cold liquids   HTN (hypertension)  -Resumed Norvasc  Depression  -Patient states he's weaned self off of the Wellbutrin  -He takes Paxil but since he does not know his dosage I will not order it for him  -He states that he was placed on them when his wife passed away; he is doing better now which is why he is trying to wean himself off   ARF (acute renal failure)  -resolved with  hydration      Procedures:  none (i.e. Studies not automatically included, echos, thoracentesis, etc; not x-rays)  Consultations:  none  Discharge Exam: Filed Vitals:   08/24/12 1800 08/24/12 2055 08/25/12 0534 08/25/12 0916  BP: 148/77 147/71 150/77 145/73  Pulse: 63 109 99 111  Temp: 97.9 F (36.6 C) 98.5 F (36.9 C) 97.9 F (36.6 C) 98.6 F (37 C)  TempSrc: Oral Oral Oral   Resp: 20 18 18 18   Height:  5' 8.5" (1.74 m)    Weight:  70.8 kg (156 lb 1.4 oz)    SpO2: 97% 98% 97% 98%    General: AAO x 3 Cardiovascular: RRR, no murmurs Respiratory: CTA b/l   Discharge Instructions      Discharge Orders   Future Orders Complete By Expires     Diet - low sodium heart healthy  As directed     Scheduling Instructions:      Low fat and diabetic    Diet - low sodium heart healthy  As directed     Comments:      And diabetic diet    Increase activity slowly  As directed     Increase activity slowly  As directed         Medication List    STOP taking these medications       WELLBUTRIN XL 300 MG 24 hr tablet  Generic drug:  buPROPion  TAKE these medications       acetaminophen 500 MG tablet  Commonly known as:  TYLENOL  Take 500 mg by mouth every 6 (six) hours as needed. For pain     amLODipine 10 MG tablet  Commonly known as:  NORVASC  Take 10 mg by mouth daily.     atorvastatin 80 MG tablet  Commonly known as:  LIPITOR  Take 80 mg by mouth daily.     calcium carbonate 500 MG chewable tablet  Commonly known as:  TUMS - dosed in mg elemental calcium  Chew 2 tablets by mouth daily.     esomeprazole 40 MG capsule  Commonly known as:  NEXIUM  Take 40 mg by mouth daily before breakfast.     ibuprofen 800 MG tablet  Commonly known as:  ADVIL,MOTRIN  Take 800 mg by mouth every 8 (eight) hours as needed. For pain     NOVOLOG [place]  Inject into the skin. Sliding scale; insulin pump     PAXIL PO  Take 40 mg by mouth daily.       No Known  Allergies    The results of significant diagnostics from this hospitalization (including imaging, microbiology, ancillary and laboratory) are listed below for reference.    Significant Diagnostic Studies: No results found.  Microbiology: Recent Results (from the past 240 hour(s))  MRSA PCR SCREENING     Status: Abnormal   Collection Time    08/23/12  7:47 PM      Result Value Range Status   MRSA by PCR POSITIVE (*) NEGATIVE Final   Comment:            The GeneXpert MRSA Assay (FDA     approved for NASAL specimens     only), is one component of a     comprehensive MRSA colonization     surveillance program. It is not     intended to diagnose MRSA     infection nor to guide or     monitor treatment for     MRSA infections.     RESULT CALLED TO, READ BACK BY AND VERIFIED WITH:     E OFORI RN [id number] 08/23/12 A BROWNING     Labs: Basic Metabolic Panel: No results found for this basename: NA, K, CL, CO2, GLUCOSE, BUN, CREATININE, CALCIUM, MG, PHOS,  in the last 168 hours Liver Function Tests: No results found for this basename: AST, ALT, ALKPHOS, BILITOT, PROT, ALBUMIN,  in the last 168 hours No results found for this basename: LIPASE, AMYLASE,  in the last 168 hours No results found for this basename: AMMONIA,  in the last 168 hours CBC: No results found for this basename: WBC, NEUTROABS, HGB, HCT, MCV, PLT,  in the last 168 hours Cardiac Enzymes: No results found for this basename: CKTOTAL, CKMB, CKMBINDEX, TROPONINI,  in the last 168 hours BNP: BNP (last 3 results) No results found for this basename: PROBNP,  in the last 8760 hours CBG: No results found for this basename: GLUCAP,  in the last 168 hours     Signed:  Lanae Federer  [facility] 09/02/2012, 10:15 AM

## 2012-10-02 ENCOUNTER — Ambulatory Visit (INDEPENDENT_AMBULATORY_CARE_PROVIDER_SITE_OTHER): Payer: BC Managed Care – PPO | Admitting: Family Medicine

## 2012-10-02 VITALS — BP 140/80 | HR 97 | Temp 98.0°F | Resp 18 | Ht 67.0 in | Wt 160.0 lb

## 2012-10-02 DIAGNOSIS — W57XXXA Bitten or stung by nonvenomous insect and other nonvenomous arthropods, initial encounter: Secondary | ICD-10-CM

## 2012-10-02 DIAGNOSIS — T148 Other injury of unspecified body region: Secondary | ICD-10-CM

## 2012-10-02 MED ORDER — METHYLPREDNISOLONE ACETATE 80 MG/ML IJ SUSP
80.0000 mg | Freq: Once | INTRAMUSCULAR | Status: AC
  Administered 2012-10-02: 80 mg via INTRAMUSCULAR

## 2012-10-02 NOTE — Progress Notes (Signed)
Subjective: 47 year old man who was mowing his lawn and got into a yellow jacket's nest. He got multiple stings on his left hand and on the back of his neck and some other arm stings. The hand has been painful and achy and feels swollen. He also has a lot of induration in the back of his neck on the left.  He is a type I diabetic well controlled on an insulin pump. He is a Engineer, civil (consulting).  Objective: Multiple stings as noted above. The hand actually doesn't look as swollen as it feels to him.  Assessment: Multiple yellow jacket's stings  Plan: Depo-Medrol 80 Monitor glucose Continue Benadryl Return if worse

## 2012-10-02 NOTE — Patient Instructions (Addendum)
Take benadryl or zyrtec for itching  Monitor glucose  Return if worse Bee, Wasp, or Hornet Sting Your caregiver has diagnosed you as having an insect sting. An insect sting appears as a red lump in the skin that sometimes has a tiny hole in the center, or it may have a stinger in the center of the wound. The most common stings are from wasps, hornets and bees. Individuals have different reactions to insect stings.  A normal reaction may cause pain, swelling, and redness around the sting site.  A localized allergic reaction may cause swelling and redness that extends beyond the sting site.  A large local reaction may continue to develop over the next 12 to 36 hours.  On occasion, the reactions can be severe (anaphylactic reaction). An anaphylactic reaction may cause wheezing; difficulty breathing; chest pain; fainting; raised, itchy, red patches on the skin; a sick feeling to your stomach (nausea); vomiting; cramping; or diarrhea. If you have had an anaphylactic reaction to an insect sting in the past, you are more likely to have one again. HOME CARE INSTRUCTIONS   With bee stings, a small sac of poison is left in the wound. Brushing across this with something such as a credit card, or anything similar, will help remove this and decrease the amount of the reaction. This same procedure will not help a wasp sting as they do not leave behind a stinger and poison sac.  Apply a cold compress for 10 to 20 minutes every hour for 1 to 2 days, depending on severity, to reduce swelling and itching.  To lessen pain, a paste made of water and baking soda may be rubbed on the bite or sting and left on for 5 minutes.  To relieve itching and swelling, you may use take medication or apply medicated creams or lotions as directed.  Only take over-the-counter or prescription medicines for pain, discomfort, or fever as directed by your caregiver.  Wash the sting site daily with soap and water. Apply  antibiotic ointment on the sting site as directed.  If you suffered a severe reaction:  If you did not require hospitalization, an adult will need to stay with you for 24 hours in case the symptoms return.  You may need to wear a medical bracelet or necklace stating the allergy.  You and your family need to learn when and how to use an anaphylaxis kit or epinephrine injection.  If you have had a severe reaction before, always carry your anaphylaxis kit with you. SEEK MEDICAL CARE IF:   None of the above helps within 2 to 3 days.  The area becomes red, warm, tender, and swollen beyond the area of the bite or sting.  You have an oral temperature above 102 F (38.9 C). SEEK IMMEDIATE MEDICAL CARE IF:  You have symptoms of an allergic reaction which are:  Wheezing.  Difficulty breathing.  Chest pain.  Lightheadedness or fainting.  Itchy, raised, red patches on the skin.  Nausea, vomiting, cramping or diarrhea. ANY OF THESE SYMPTOMS MAY REPRESENT A SERIOUS PROBLEM THAT IS AN EMERGENCY. Do not wait to see if the symptoms will go away. Get medical help right away. Call your local emergency services (911 in [place] DO NOT drive yourself to the hospital. MAKE SURE YOU:   Understand these instructions.  Will watch your condition.  Will get help right away if you are not doing well or get worse. Document Released: 01/12/2005 Document Revised: 04/06/2011 Document Reviewed: 06/29/2009 [employer] Patient  Information 2014 [employer].  

## 2013-07-06 ENCOUNTER — Emergency Department (HOSPITAL_COMMUNITY)
Admission: EM | Admit: 2013-07-06 | Discharge: 2013-07-07 | Disposition: A | Discharge status: Home / Self Care | Payer: BC Managed Care – PPO | Attending: Emergency Medicine | Admitting: Emergency Medicine

## 2013-07-06 ENCOUNTER — Encounter (HOSPITAL_COMMUNITY): Payer: Self-pay | Admitting: Emergency Medicine

## 2013-07-06 DIAGNOSIS — Z794 Long term (current) use of insulin: Secondary | ICD-10-CM | POA: Insufficient documentation

## 2013-07-06 DIAGNOSIS — F329 Major depressive disorder, single episode, unspecified: Secondary | ICD-10-CM | POA: Insufficient documentation

## 2013-07-06 DIAGNOSIS — R4182 Altered mental status, unspecified: Secondary | ICD-10-CM | POA: Insufficient documentation

## 2013-07-06 DIAGNOSIS — I10 Essential (primary) hypertension: Secondary | ICD-10-CM | POA: Insufficient documentation

## 2013-07-06 DIAGNOSIS — E162 Hypoglycemia, unspecified: Secondary | ICD-10-CM

## 2013-07-06 DIAGNOSIS — E1169 Type 2 diabetes mellitus with other specified complication: Secondary | ICD-10-CM | POA: Insufficient documentation

## 2013-07-06 DIAGNOSIS — F3289 Other specified depressive episodes: Secondary | ICD-10-CM | POA: Insufficient documentation

## 2013-07-06 DIAGNOSIS — Z79899 Other long term (current) drug therapy: Secondary | ICD-10-CM | POA: Insufficient documentation

## 2013-07-06 DIAGNOSIS — E7800 Pure hypercholesterolemia, unspecified: Secondary | ICD-10-CM | POA: Insufficient documentation

## 2013-07-06 LAB — CBG MONITORING, ED: GLUCOSE-CAPILLARY: 77 mg/dL (ref 70–99)

## 2013-07-06 NOTE — ED Notes (Signed)
Bed: WA08 Expected date:  Expected time:  Means of arrival:  Comments: Pt in custody [employer] - ? Diabetic issues
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
## 2013-07-07 LAB — COMPREHENSIVE METABOLIC PANEL
ALBUMIN: 3.8 g/dL (ref 3.5–5.2)
ALT: 48 U/L (ref 0–53)
AST: 69 U/L — AB (ref 0–37)
Alkaline Phosphatase: 118 U/L — ABNORMAL HIGH (ref 39–117)
BILIRUBIN TOTAL: 0.3 mg/dL (ref 0.3–1.2)
BUN: 14 mg/dL (ref 6–23)
CHLORIDE: 98 meq/L (ref 96–112)
CO2: 27 meq/L (ref 19–32)
Calcium: 9.5 mg/dL (ref 8.4–10.5)
Creatinine, Ser: 1.35 mg/dL (ref 0.50–1.35)
GFR calc Af Amer: 71 mL/min — ABNORMAL LOW (ref >90)
GFR, EST NON AFRICAN AMERICAN: 62 mL/min — AB (ref >90)
Glucose, Bld: 58 mg/dL — ABNORMAL LOW (ref 70–99)
Potassium: 3.7 mEq/L (ref 3.7–5.3)
SODIUM: 139 meq/L (ref 137–147)
Total Protein: 7.6 g/dL (ref 6.0–8.3)

## 2013-07-07 LAB — CBC WITH DIFFERENTIAL/PLATELET
BASOS ABS: 0.1 10*3/uL (ref 0.0–0.1)
Basophils Relative: 1 % (ref 0–1)
Eosinophils Absolute: 0.1 10*3/uL (ref 0.0–0.7)
Eosinophils Relative: 1 % (ref 0–5)
HCT: 40.6 % (ref 39.0–52.0)
Hemoglobin: 13.9 g/dL (ref 13.0–17.0)
Lymphocytes Relative: 11 % — ABNORMAL LOW (ref 12–46)
Lymphs Abs: 0.8 10*3/uL (ref 0.7–4.0)
MCH: 32.6 pg (ref 26.0–34.0)
MCHC: 34.2 g/dL (ref 30.0–36.0)
MCV: 95.3 fL (ref 78.0–100.0)
Monocytes Absolute: 0.8 10*3/uL (ref 0.1–1.0)
Monocytes Relative: 11 % (ref 3–12)
NEUTROS ABS: 5.4 10*3/uL (ref 1.7–7.7)
Neutrophils Relative %: 76 % (ref 43–77)
PLATELETS: 310 10*3/uL (ref 150–400)
RBC: 4.26 MIL/uL (ref 4.22–5.81)
RDW: 12.9 % (ref 11.5–15.5)
WBC: 7 10*3/uL (ref 4.0–10.5)

## 2013-07-07 LAB — CBG MONITORING, ED
GLUCOSE-CAPILLARY: 49 mg/dL — AB (ref 70–99)
Glucose-Capillary: 237 mg/dL — ABNORMAL HIGH (ref 70–99)
Glucose-Capillary: 238 mg/dL — ABNORMAL HIGH (ref 70–99)

## 2013-07-07 MED ORDER — DEXTROSE 50 % IV SOLN
1.0000 | Freq: Once | INTRAVENOUS | Status: AC
  Administered 2013-07-07: 50 mL via INTRAVENOUS
  Filled 2013-07-07: qty 50

## 2013-07-07 NOTE — ED Notes (Signed)
Pt states he needs some protein to keep his sugar up, pt given [place]key sandwich, crackers, peanut butter and diet coke.

## 2013-07-07 NOTE — Discharge Instructions (Signed)
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
## 2014-08-01 ENCOUNTER — Inpatient Hospital Stay (HOSPITAL_COMMUNITY): Payer: BLUE CROSS/BLUE SHIELD

## 2014-08-01 ENCOUNTER — Encounter (HOSPITAL_COMMUNITY): Payer: Self-pay | Admitting: Emergency Medicine

## 2014-08-01 ENCOUNTER — Inpatient Hospital Stay (HOSPITAL_COMMUNITY)
Admission: EM | Admit: 2014-08-01 | Discharge: 2014-08-02 | DRG: 919 | Disposition: A | Discharge status: Home / Self Care | Payer: BLUE CROSS/BLUE SHIELD | Attending: Internal Medicine | Admitting: Internal Medicine

## 2014-08-01 DIAGNOSIS — N179 Acute kidney failure, unspecified: Secondary | ICD-10-CM | POA: Diagnosis present

## 2014-08-01 DIAGNOSIS — T85694A Other mechanical complication of insulin pump, initial encounter: Secondary | ICD-10-CM | POA: Diagnosis not present

## 2014-08-01 DIAGNOSIS — Z9114 Patient's other noncompliance with medication regimen: Secondary | ICD-10-CM | POA: Diagnosis present

## 2014-08-01 DIAGNOSIS — K30 Functional dyspepsia: Secondary | ICD-10-CM | POA: Diagnosis present

## 2014-08-01 DIAGNOSIS — E1110 Type 2 diabetes mellitus with ketoacidosis without coma: Secondary | ICD-10-CM

## 2014-08-01 DIAGNOSIS — I10 Essential (primary) hypertension: Secondary | ICD-10-CM

## 2014-08-01 DIAGNOSIS — E869 Volume depletion, unspecified: Secondary | ICD-10-CM

## 2014-08-01 DIAGNOSIS — E1036 Type 1 diabetes mellitus with diabetic cataract: Secondary | ICD-10-CM

## 2014-08-01 DIAGNOSIS — E1010 Type 1 diabetes mellitus with ketoacidosis without coma: Secondary | ICD-10-CM

## 2014-08-01 DIAGNOSIS — Z79899 Other long term (current) drug therapy: Secondary | ICD-10-CM

## 2014-08-01 DIAGNOSIS — E1310 Other specified diabetes mellitus with ketoacidosis without coma: Secondary | ICD-10-CM | POA: Diagnosis present

## 2014-08-01 DIAGNOSIS — R0600 Dyspnea, unspecified: Secondary | ICD-10-CM | POA: Diagnosis present

## 2014-08-01 DIAGNOSIS — E785 Hyperlipidemia, unspecified: Secondary | ICD-10-CM | POA: Diagnosis present

## 2014-08-01 DIAGNOSIS — E780 Pure hypercholesterolemia: Secondary | ICD-10-CM | POA: Diagnosis present

## 2014-08-01 DIAGNOSIS — Z794 Long term (current) use of insulin: Secondary | ICD-10-CM

## 2014-08-01 DIAGNOSIS — R0602 Shortness of breath: Secondary | ICD-10-CM

## 2014-08-01 HISTORY — DX: Type 2 diabetes mellitus with ketoacidosis without coma: E11.10

## 2014-08-01 HISTORY — DX: Gastro-esophageal reflux disease without esophagitis: K21.9

## 2014-08-01 LAB — BASIC METABOLIC PANEL
Anion gap: 33 — ABNORMAL HIGH (ref 5–15)
Anion gap: 25 — ABNORMAL HIGH (ref 5–15)
Anion gap: 12 (ref 5–15)
BUN: 35 mg/dL — AB (ref 6–20)
BUN: 37 mg/dL — ABNORMAL HIGH (ref 6–20)
BUN: 35 mg/dL — ABNORMAL HIGH (ref 6–20)
CHLORIDE: 83 mmol/L — AB (ref 101–111)
CHLORIDE: 87 mmol/L — AB (ref 101–111)
CO2: 10 mmol/L — ABNORMAL LOW (ref 22–32)
CO2: 16 mmol/L — ABNORMAL LOW (ref 22–32)
CO2: 27 mmol/L (ref 22–32)
CREATININE: 2.88 mg/dL — AB (ref 0.61–1.24)
Calcium: 11.2 mg/dL — ABNORMAL HIGH (ref 8.9–10.3)
Calcium: 11.1 mg/dL — ABNORMAL HIGH (ref 8.9–10.3)
Calcium: 10.8 mg/dL — ABNORMAL HIGH (ref 8.9–10.3)
Chloride: 92 mmol/L — ABNORMAL LOW (ref 101–111)
Creatinine, Ser: 2.58 mg/dL — ABNORMAL HIGH (ref 0.61–1.24)
Creatinine, Ser: 1.91 mg/dL — ABNORMAL HIGH (ref 0.61–1.24)
GFR calc Af Amer: 28 mL/min — ABNORMAL LOW (ref >60)
GFR calc Af Amer: 32 mL/min — ABNORMAL LOW (ref >60)
GFR calc Af Amer: 47 mL/min — ABNORMAL LOW (ref >60)
GFR calc non Af Amer: 24 mL/min — ABNORMAL LOW (ref >60)
GFR calc non Af Amer: 40 mL/min — ABNORMAL LOW (ref >60)
GFR, EST NON AFRICAN AMERICAN: 28 mL/min — AB (ref >60)
GLUCOSE: 585 mg/dL — AB (ref 65–99)
GLUCOSE: 445 mg/dL — AB (ref 65–99)
GLUCOSE: 244 mg/dL — AB (ref 65–99)
POTASSIUM: 4.4 mmol/L (ref 3.5–5.1)
POTASSIUM: 4.2 mmol/L (ref 3.5–5.1)
Potassium: 4.7 mmol/L (ref 3.5–5.1)
SODIUM: 128 mmol/L — AB (ref 135–145)
Sodium: 126 mmol/L — ABNORMAL LOW (ref 135–145)
Sodium: 131 mmol/L — ABNORMAL LOW (ref 135–145)

## 2014-08-01 LAB — GLUCOSE, CAPILLARY
GLUCOSE-CAPILLARY: 562 mg/dL — AB (ref 65–99)
GLUCOSE-CAPILLARY: 397 mg/dL — AB (ref 65–99)
Glucose-Capillary: 146 mg/dL — ABNORMAL HIGH (ref 65–99)
Glucose-Capillary: 172 mg/dL — ABNORMAL HIGH (ref 65–99)
Glucose-Capillary: 216 mg/dL — ABNORMAL HIGH (ref 65–99)
Glucose-Capillary: 303 mg/dL — ABNORMAL HIGH (ref 65–99)
Glucose-Capillary: 321 mg/dL — ABNORMAL HIGH (ref 65–99)
Glucose-Capillary: 437 mg/dL — ABNORMAL HIGH (ref 65–99)
Glucose-Capillary: 541 mg/dL — ABNORMAL HIGH (ref 65–99)

## 2014-08-01 LAB — URINE MICROSCOPIC-ADD ON

## 2014-08-01 LAB — COMPREHENSIVE METABOLIC PANEL
ALT: 55 U/L (ref 17–63)
AST: 55 U/L — AB (ref 15–41)
Albumin: 4.1 g/dL (ref 3.5–5.0)
Alkaline Phosphatase: 70 U/L (ref 38–126)
Anion gap: 44 — ABNORMAL HIGH (ref 5–15)
BUN: 37 mg/dL — ABNORMAL HIGH (ref 6–20)
CO2: 7 mmol/L — ABNORMAL LOW (ref 22–32)
CREATININE: 3.14 mg/dL — AB (ref 0.61–1.24)
Calcium: 12.8 mg/dL — ABNORMAL HIGH (ref 8.9–10.3)
Chloride: 71 mmol/L — ABNORMAL LOW (ref 101–111)
GFR, EST AFRICAN AMERICAN: 26 mL/min — AB (ref >60)
GFR, EST NON AFRICAN AMERICAN: 22 mL/min — AB (ref >60)
Glucose, Bld: 847 mg/dL (ref 65–99)
Potassium: 6.1 mmol/L (ref 3.5–5.1)
SODIUM: 122 mmol/L — AB (ref 135–145)
TOTAL PROTEIN: 7.3 g/dL (ref 6.5–8.1)
Total Bilirubin: 2.6 mg/dL — ABNORMAL HIGH (ref 0.3–1.2)

## 2014-08-01 LAB — URINALYSIS, ROUTINE W REFLEX MICROSCOPIC
Hgb urine dipstick: NEGATIVE
Ketones, ur: 40 mg/dL — AB
Leukocytes, UA: NEGATIVE
Nitrite: NEGATIVE
Protein, ur: 30 mg/dL — AB
SPECIFIC GRAVITY, URINE: 1.019 (ref 1.005–1.030)
Urobilinogen, UA: 0.2 mg/dL (ref 0.0–1.0)
pH: 5 (ref 5.0–8.0)

## 2014-08-01 LAB — I-STAT VENOUS BLOOD GAS, ED
ACID-BASE DEFICIT: 19 mmol/L — AB (ref 0.0–2.0)
Bicarbonate: 6.4 mEq/L — ABNORMAL LOW (ref 20.0–24.0)
O2 Saturation: 85 %
PCO2 VEN: 17.6 mmHg — AB (ref 45.0–50.0)
PO2 VEN: 61 mmHg — AB (ref 30.0–45.0)
TCO2: 7 mmol/L (ref 0–100)
pH, Ven: 7.17 — CL (ref 7.250–7.300)

## 2014-08-01 LAB — ETHANOL: Alcohol, Ethyl (B): <5 mg/dL (ref <5)

## 2014-08-01 LAB — CBG MONITORING, ED: Glucose-Capillary: >600 mg/dL (ref 65–99)

## 2014-08-01 LAB — CBC WITH DIFFERENTIAL/PLATELET
BASOS PCT: 0 % (ref 0–1)
Basophils Absolute: 0 10*3/uL (ref 0.0–0.1)
EOS PCT: 0 % (ref 0–5)
Eosinophils Absolute: 0 10*3/uL (ref 0.0–0.7)
HCT: 40 % (ref 39.0–52.0)
Hemoglobin: 13.4 g/dL (ref 13.0–17.0)
LYMPHS ABS: 0.6 10*3/uL — AB (ref 0.7–4.0)
Lymphocytes Relative: 5 % — ABNORMAL LOW (ref 12–46)
MCH: 33.7 pg (ref 26.0–34.0)
MCHC: 33.5 g/dL (ref 30.0–36.0)
MCV: 100.5 fL — AB (ref 78.0–100.0)
MONOS PCT: 12 % (ref 3–12)
Monocytes Absolute: 1.5 10*3/uL — ABNORMAL HIGH (ref 0.1–1.0)
Neutro Abs: 10 10*3/uL — ABNORMAL HIGH (ref 1.7–7.7)
Neutrophils Relative %: 83 % — ABNORMAL HIGH (ref 43–77)
Platelets: 245 10*3/uL (ref 150–400)
RBC: 3.98 MIL/uL — AB (ref 4.22–5.81)
RDW: 12.2 % (ref 11.5–15.5)
WBC: 12.1 10*3/uL — ABNORMAL HIGH (ref 4.0–10.5)

## 2014-08-01 LAB — LIPASE, BLOOD: LIPASE: 750 U/L — AB (ref 22–51)

## 2014-08-01 LAB — MRSA PCR SCREENING: MRSA by PCR: POSITIVE — AB

## 2014-08-01 LAB — LACTIC ACID, PLASMA: Lactic Acid, Venous: 2.8 mmol/L (ref 0.5–2.0)

## 2014-08-01 LAB — I-STAT CG4 LACTIC ACID, ED: Lactic Acid, Venous: 7.09 mmol/L (ref 0.5–2.0)

## 2014-08-01 MED ORDER — SODIUM CHLORIDE 0.9 % IV SOLN
INTRAVENOUS | Status: AC
  Administered 2014-08-01: 14:00:00 via INTRAVENOUS

## 2014-08-01 MED ORDER — SODIUM CHLORIDE 0.9 % IV BOLUS (SEPSIS)
1000.0000 mL | Freq: Once | INTRAVENOUS | Status: AC
  Administered 2014-08-01: 1000 mL via INTRAVENOUS

## 2014-08-01 MED ORDER — SODIUM CHLORIDE 0.9 % IV SOLN
INTRAVENOUS | Status: DC
  Administered 2014-08-01: 14:00:00 via INTRAVENOUS

## 2014-08-01 MED ORDER — DEXTROSE-NACL 5-0.45 % IV SOLN: INTRAVENOUS | Status: DC

## 2014-08-01 MED ORDER — ONDANSETRON HCL 4 MG/2ML IJ SOLN
4.0000 mg | Freq: Once | INTRAMUSCULAR | Status: AC
  Administered 2014-08-01: 4 mg via INTRAVENOUS
  Filled 2014-08-01: qty 2

## 2014-08-01 MED ORDER — ONDANSETRON HCL 4 MG/2ML IJ SOLN: 4.0000 mg | Freq: Three times a day (TID) | INTRAMUSCULAR | Status: AC | PRN

## 2014-08-01 MED ORDER — SODIUM CHLORIDE 0.9 % IV SOLN
INTRAVENOUS | Status: DC
  Administered 2014-08-01: 10.1 [IU]/h via INTRAVENOUS
  Administered 2014-08-01: 5.4 [IU]/h via INTRAVENOUS

## 2014-08-01 MED ORDER — PANTOPRAZOLE SODIUM 40 MG IV SOLR
40.0000 mg | Freq: Once | INTRAVENOUS | Status: AC
  Administered 2014-08-01: 40 mg via INTRAVENOUS
  Filled 2014-08-01: qty 40

## 2014-08-01 MED ORDER — SODIUM CHLORIDE 0.9 % IV SOLN
INTRAVENOUS | Status: DC
  Administered 2014-08-01: 5.4 [IU]/h via INTRAVENOUS
  Filled 2014-08-01: qty 2.5

## 2014-08-01 MED ORDER — GI COCKTAIL ~~LOC~~
30.0000 mL | Freq: Once | ORAL | Status: AC
  Administered 2014-08-01: 30 mL via ORAL
  Filled 2014-08-01: qty 30

## 2014-08-01 MED ORDER — HEPARIN SODIUM (PORCINE) 5000 UNIT/ML IJ SOLN
5000.0000 [IU] | Freq: Three times a day (TID) | INTRAMUSCULAR | Status: DC
  Filled 2014-08-01 (×5): qty 1

## 2014-08-01 MED ORDER — SODIUM CHLORIDE 0.9 % IV SOLN
1000.0000 mL | INTRAVENOUS | Status: DC
  Administered 2014-08-01: 1000 mL via INTRAVENOUS

## 2014-08-01 MED ORDER — DEXTROSE-NACL 5-0.45 % IV SOLN
INTRAVENOUS | Status: DC
  Administered 2014-08-01: 22:00:00 via INTRAVENOUS

## 2014-08-01 NOTE — ED Notes (Signed)
Pt sts N/V intermittently x several days; pt sts unable to keep food down x 2 days; pt hyperventilating; pt sts hyperglycemia as well; pt sts some diarrhea last night

## 2014-08-01 NOTE — Progress Notes (Signed)
CRITICAL VALUE ALERT  Critical value received:  Lactic acid  Date of notification:  08/01/14  Time of notification:  1840  Critical value read back:Yes.    Nurse who received alert:  Lorin PicketLindsey Jesscia Imm  MD notified (1st page):  IM Resient  Time of first page:  1840  MD notified (2nd page):  Time of second page:  Responding MD:  IM Resident  Time MD responded:  [phone]

## 2014-08-01 NOTE — ED Notes (Signed)
Patient transported to X-ray 

## 2014-08-01 NOTE — H&P (Signed)
Date: 08/01/2014               Patient Name:  Jonathan Colon MRN: [id number]  DOB: [birth year] Age / Sex: 49 y.o., male   PCP: Elizabeth Palau, FNP         Medical Service: Internal Medicine Teaching Service         Attending Physician: Dr. Doneen Poisson, MD    First Contact: Dr. Sheliah Hatch Pager: [phone]  Second Contact: Dr. Inocente Salles Pager: [phone]       After Hours (After 5p/  First Contact Pager: [phone]  weekends / holidays): Second Contact Pager: [phone]   Chief Complaint: Nausea  History of Present Illness: Jonathan Colon is a 49 yo male with PMH of T1DM, HTN, HLD, and multiple episodes of DKA.  He reports that over a week ago he dropped his medtronic insulin pump and it broke. He has been working for the past week or so with his PCP to get a new meter but reports that his insurance has not yet approved it.  He has been trying to manage his diabetes with multiple daily injections but reports he has been doing a poor job and notes that his sugars have frequently been in the 300s-400s but also at times has been in 100s.  He notes he has been using 10-12 units of lantus in the morning, 10-15 units of Novolog with breakfast, 5-8 units of Novolog with lunch, and 10-17 units of Novolog with dinner.  However for the past two days he reports increase nausea, vomiting and some abdominal discomfort (attributes to vomiting.) He denies any fever, admits some chills, denies any diarrhea or vomiting, denies dysuria.  Meds: Current Facility-Administered Medications  Medication Dose Route Frequency Provider Last Rate Last Dose  . 0.9 %  sodium chloride infusion  1,000 mL Intravenous Continuous Gerhard Munch, MD 125 mL/hr at 08/01/14 1058 1,000 mL at 08/01/14 1058  . dextrose 5 %-0.45 % sodium chloride infusion   Intravenous Continuous Gerhard Munch, MD      . insulin regular (NOVOLIN R,HUMULIN R) 250 Units in sodium chloride 0.9 % 250 mL (1 Units/mL) infusion   Intravenous  Continuous Gerhard Munch, MD 5.4 mL/hr at 08/01/14 1152 5.4 Units/hr at 08/01/14 1152   Current Outpatient Prescriptions  Medication Sig Dispense Refill  . acetaminophen (TYLENOL) 500 MG tablet Take 500 mg by mouth every 6 (six) hours as needed. For pain    . amLODipine-benazepril (LOTREL) 10-20 MG per capsule Take 1 capsule by mouth daily.    Marland Kitchen atorvastatin (LIPITOR) 80 MG tablet Take 80 mg by mouth daily.    . calcium carbonate (TUMS - DOSED IN MG ELEMENTAL CALCIUM) 500 MG chewable tablet Chew 2 tablets by mouth daily.    Marland Kitchen esomeprazole (NEXIUM) 40 MG capsule Take 40 mg by mouth daily before breakfast.    . ibuprofen (ADVIL,MOTRIN) 800 MG tablet Take 800 mg by mouth every 8 (eight) hours as needed. For pain    . insulin aspart (NOVOLOG) 100 UNIT/ML injection Inject 2-10 Units into the skin 3 (three) times daily before meals. 2 units in the morning, 5 units in the afternoon and 10 units at night    . insulin glargine (LANTUS) 100 UNIT/ML injection Inject 12 Units into the skin at bedtime.    Marland Kitchen PARoxetine HCl (PAXIL PO) Take 40 mg by mouth daily.       Allergies: Allergies as of 08/01/2014  . (No Known Allergies)   Past Medical  History  Diagnosis Date  . Hypertension   . Hypercholesteremia   . Depression   . Diabetes mellitus     has current insulin pump  . DKA (diabetic ketoacidoses)    Past Surgical History  Procedure Laterality Date  . Eye surgery      1992  . Vasectomy     History reviewed. No pertinent family history. History   Social History  . Marital Status: Married    Spouse Name: N/A  . Number of Children: N/A  . Years of Education: N/A   Occupational History  . Not on file.   Social History Main Topics  . Smoking status: Never Smoker   . Smokeless tobacco: Never Used  . Alcohol Use: Yes  . Drug Use: No  . Sexual Activity: Yes    Birth Control/ Protection: Surgical   Other Topics Concern  . Not on file   Social History Narrative    Review of  Systems: Review of Systems  Constitutional: Positive for chills and malaise/fatigue. Negative for fever.  Eyes: Negative for blurred vision and photophobia.  Respiratory: Positive for shortness of breath. Negative for cough, hemoptysis and wheezing.   Cardiovascular: Negative for chest pain and leg swelling.  Gastrointestinal: Positive for heartburn, nausea, vomiting and abdominal pain. Negative for diarrhea, constipation and blood in stool.  Genitourinary: Negative for dysuria, urgency and frequency.  Musculoskeletal: Negative for myalgias.  Neurological: Positive for headaches. Negative for dizziness.  Endo/Heme/Allergies: Positive for polydipsia.  Psychiatric/Behavioral: Negative for substance abuse.    Physical Exam: Blood pressure 132/45, pulse 122, resp. rate 21, SpO2 100 %. Physical Exam  Constitutional: He is oriented to person, place, and time and well-developed, well-nourished, and in no distress.  HENT:  Head: Normocephalic and atraumatic.  Mouth/Throat: Oropharynx is clear and moist.  Eyes: Conjunctivae are normal.  Cardiovascular: Regular rhythm.  Tachycardia present.   No murmur heard. Pulmonary/Chest: Breath sounds normal. Tachypnea noted. He has no wheezes. He has no rales.  Abdominal: Soft. Bowel sounds are normal. He exhibits no distension. There is no tenderness.  Musculoskeletal: He exhibits no edema.  Neurological: He is alert and oriented to person, place, and time.  Skin: Skin is warm and dry.  Nursing note and vitals reviewed.    Lab results: Basic Metabolic Panel:  Recent Labs  40/98/11 0958  NA 122*  K 6.1*  CL 71*  CO2 7*  GLUCOSE 847*  BUN 37*  CREATININE 3.14*  CALCIUM 12.8*   Liver Function Tests:  Recent Labs  08/01/14 0958  AST 55*  ALT 55  ALKPHOS 70  BILITOT 2.6*  PROT 7.3  ALBUMIN 4.1    Recent Labs  08/01/14 0958  LIPASE 750*   No results for input(s): AMMONIA in the last 72 hours. CBC:  Recent Labs   08/01/14 0958  WBC 12.1*  NEUTROABS 10.0*  HGB 13.4  HCT 40.0  MCV 100.5*  PLT 245   Cardiac Enzymes: No results for input(s): CKTOTAL, CKMB, CKMBINDEX, TROPONINI in the last 72 hours. BNP: No results for input(s): PROBNP in the last 72 hours. D-Dimer: No results for input(s): DDIMER in the last 72 hours. CBG:  Recent Labs  08/01/14 0959 08/01/14 1139  GLUCAP >600* >600*   Hemoglobin A1C: No results for input(s): HGBA1C in the last 72 hours. Fasting Lipid Panel: No results for input(s): CHOL, HDL, LDLCALC, TRIG, CHOLHDL, LDLDIRECT in the last 72 hours. Thyroid Function Tests: No results for input(s): TSH, T4TOTAL, FREET4, T3FREE, THYROIDAB in the  last 72 hours. Anemia Panel: No results for input(s): VITAMINB12, FOLATE, FERRITIN, TIBC, IRON, RETICCTPCT in the last 72 hours. Coagulation: No results for input(s): LABPROT, INR in the last 72 hours. Urine Drug Screen: Drugs of Abuse     Component Value Date/Time   LABOPIA NONE DETECTED 07/09/2011 1937   COCAINSCRNUR NONE DETECTED 07/09/2011 1937   LABBENZ NONE DETECTED 07/09/2011 1937   AMPHETMU NONE DETECTED 07/09/2011 1937   THCU NONE DETECTED 07/09/2011 1937   LABBARB NONE DETECTED 07/09/2011 1937    Alcohol Level:  Recent Labs  08/01/14 0958  ETH <5   Urinalysis:  Recent Labs  08/01/14 1017  COLORURINE YELLOW  LABSPEC 1.019  PHURINE 5.0  GLUCOSEU >1000*  HGBUR NEGATIVE  BILIRUBINUR SMALL*  KETONESUR 40*  PROTEINUR 30*  UROBILINOGEN 0.2  NITRITE NEGATIVE  LEUKOCYTESUR NEGATIVE    Imaging results:  Dg Chest 2 View  08/01/2014   CLINICAL DATA:  Two day history of shortness of breath. Diabetic ketoacidosis.  EXAM: CHEST  2 VIEW  COMPARISON:  January 23, 2012  FINDINGS: Lungs are clear. Heart size and pulmonary vascularity are normal. No adenopathy. No bone lesions. There is an azygos lobe on the right, an anatomic variant.  IMPRESSION: No edema or consolidation.   Electronically Signed   By: Bretta BangWilliam   Woodruff III M.D.   On: 08/01/2014 12:36    Other results: EKG: none  Assessment & Plan by Problem:   DKA (diabetic ketoacidoses) with a lactic acidosis - Patient with glucose of 847, CO2 of 7, AG of 40, VBG pH of 7.17, ketones on UA but also lactic acid of 7.09.  Suspect cause is inadequate insulin intake after transition to [facility] [place] insulin, do not suspect active infection, Increased lactic acid likely due to dehydration.  Lipase elevation is likely due to vomiting. - Admit to stepdown -DKA protocol, with IV insulin drip -IVF, given 2L in ED, will start at 150cc of NS -insulin gtt per protocol,  transition to sq when anion gap is normal, bicarb is >18  -transition to D51/2 NS after blood glucose is <250, transition to NSL when able to take po.  -close observation of K, replace as indicated. No K+ to start as K+ is 6.1 -Trend lactic acid -Diet: may have non coloric liquids but otherwise npo    Dyspnea - Patient complaints of mild dyspnea, denies cough, denies chest pain.  Given DKA I checked a CXR which is normal.  He is tachycardic (DKA) and not hypoxic (SpO2 at 100%) I am not clinically concerned for a PE at this time and his subjective dyspnea is likely due to compensatory change i respiratorations to counteract his metabolic acidosis. - Will treat underlying DKA.    HTN (hypertension)- currently normotensive - Hold Home Amlodipine-Benazepril 10-20mg  -Monitor    HLD (hyperlipidemia) - Hold atorvastatin - Can resume when taking PO    Hypercalcemia - ? Due to increased TUMs injections (had ~8-10 pills of tums at bedside with him), reports he has been treating heartburn. - Will confirm with ionized calcium - If elevated will check PTH  AKI - Secondary to volume depletion in DKA - IVF as above  Indigestion - GI cocktail - Give IV Protonix 40mg  x1. Can likely transition to PO Protonix tomorrow.  Dispo: Disposition is deferred at this time, awaiting improvement of current  medical problems. Anticipated discharge in approximately 2 day(s).   The patient does have a current PCP Elizabeth Palau(Teresa Anderson, FNP) and does not need an  [facility] hospital follow-up appointment after discharge.  The patient does not have transportation limitations that hinder transportation to clinic appointments.  Signed: Gust Rung, DO 08/01/2014, 1:04 PM

## 2014-08-01 NOTE — ED Notes (Signed)
Pt reports having "heart-burn" and requests a remedy. RN Shanda BumpsJessica informed of situation.

## 2014-08-01 NOTE — Progress Notes (Signed)
Utilization review completed.  

## 2014-08-01 NOTE — ED Notes (Signed)
Pt placed on 5-lead. 

## 2014-08-01 NOTE — Progress Notes (Signed)
CRITICAL VALUE ALERT  Critical value received:  Glucose  Date of notification:  08/01/14  Time of notification:  1500  Critical value read back:Yes.    Nurse who received alert:  Lorin PicketLindsey Devlyn Parish  MD notified (1st page): Josem KaufmannKlima  Time of first page:  1500  MD notified (2nd page):  Time of second page:  Responding MD:  Josem KaufmannKlima  Time MD responded:  1500

## 2014-08-01 NOTE — ED Notes (Signed)
CBG > 600  RN Jessica informed of result.

## 2014-08-01 NOTE — ED Provider Notes (Signed)
CSN: [id number]     Arrival date & time 08/01/14  [id number] History   First MD Initiated Contact with Patient 08/01/14 [phone]     Chief Complaint  Patient presents with  . Emesis    HPI  Patient presents with concern of nausea, vomiting, anorexia. Symptoms present for some time, worse over the past 2 days. Patient states that in spite of taking all medication, symptoms are intolerable. There is crampy upper abdominal pain inconsistently, and none presently. No fever, chills, confusion, disorientation. Patient acknowledges poor medication compliance with insulin.   Past Medical History  Diagnosis Date  . Hypertension   . Hypercholesteremia   . Depression   . Diabetes mellitus     has current insulin pump  . DKA (diabetic ketoacidoses)    Past Surgical History  Procedure Laterality Date  . Eye surgery      1992  . Vasectomy     History reviewed. No pertinent family history. History  Substance Use Topics  . Smoking status: Never Smoker   . Smokeless tobacco: Never Used  . Alcohol Use: Yes    Review of Systems  Constitutional:       Per HPI, otherwise negative  HENT:       Per HPI, otherwise negative  Respiratory:       Per HPI, otherwise negative  Cardiovascular:       Per HPI, otherwise negative  Gastrointestinal: Positive for nausea, vomiting and abdominal pain. Negative for diarrhea.  Endocrine:       Negative aside from HPI  Genitourinary:       Neg aside from HPI   Musculoskeletal:       Per HPI, otherwise negative  Skin: Negative.   Neurological: Positive for weakness. Negative for syncope.      Allergies  Review of patient's allergies indicates no known allergies.  Home Medications   Prior to Admission medications   Medication Sig Start Date End Date Taking? Authorizing Provider  acetaminophen (TYLENOL) 500 MG tablet Take 500 mg by mouth every 6 (six) hours as needed. For pain    Historical Provider, MD  amLODipine (NORVASC) 10 MG tablet Take 10 mg by  mouth daily.    Historical Provider, MD  atorvastatin (LIPITOR) 80 MG tablet Take 80 mg by mouth daily.    Historical Provider, MD  calcium carbonate (TUMS - DOSED IN MG ELEMENTAL CALCIUM) 500 MG chewable tablet Chew 2 tablets by mouth daily.    Historical Provider, MD  esomeprazole (NEXIUM) 40 MG capsule Take 40 mg by mouth daily before breakfast.    Historical Provider, MD  ibuprofen (ADVIL,MOTRIN) 800 MG tablet Take 800 mg by mouth every 8 (eight) hours as needed. For pain    Historical Provider, MD  Insulin Aspart (NOVOLOG [place]) Inject into the skin. Sliding scale; insulin pump    Historical Provider, MD  PARoxetine HCl (PAXIL PO) Take 40 mg by mouth daily.     Historical Provider, MD   BP 154/74 mmHg  Pulse 126  Resp 26  SpO2 100% Physical Exam  Constitutional: He is oriented to person, place, and time. He appears well-developed. No distress.  Ill-appearing male awake, alert, answering questions appropriately  HENT:  Head: Normocephalic and atraumatic.  Eyes: Conjunctivae and EOM are normal.  Cardiovascular: Regular rhythm.  Tachycardia present.   Pulmonary/Chest: Effort normal. No stridor. No respiratory distress.  Abdominal: He exhibits no distension.  Musculoskeletal: He exhibits no edema.  Neurological: He is alert and oriented to person, place,  and time.  Skin: Skin is warm and dry.  Psychiatric: He has a normal mood and affect.  Nursing note and vitals reviewed.   ED Course  Procedures (including critical care time) Labs Review Labs Reviewed  COMPREHENSIVE METABOLIC PANEL - Abnormal; Notable for the following:    Sodium 122 (*)    Potassium 6.1 (*)    Chloride 71 (*)    CO2 7 (*)    Glucose, Bld 847 (*)    BUN 37 (*)    Creatinine, Ser 3.14 (*)    Calcium 12.8 (*)    AST 55 (*)    Total Bilirubin 2.6 (*)    GFR calc non Af Amer 22 (*)    GFR calc Af Amer 26 (*)    Anion gap 44 (*)    All other components within normal limits  CBC WITH DIFFERENTIAL/PLATELET -  Abnormal; Notable for the following:    WBC 12.1 (*)    RBC 3.98 (*)    MCV 100.5 (*)    Neutrophils Relative % 83 (*)    Neutro Abs 10.0 (*)    Lymphocytes Relative 5 (*)    Lymphs Abs 0.6 (*)    Monocytes Absolute 1.5 (*)    All other components within normal limits  CBG MONITORING, ED - Abnormal; Notable for the following:    Glucose-Capillary >600 (*)    All other components within normal limits  I-STAT CG4 LACTIC ACID, ED - Abnormal; Notable for the following:    Lactic Acid, Venous 7.09 (*)    All other components within normal limits  I-STAT VENOUS BLOOD GAS, ED - Abnormal; Notable for the following:    pH, Ven 7.170 (*)    pCO2, Ven 17.6 (*)    pO2, Ven 61.0 (*)    Bicarbonate 6.4 (*)    Acid-base deficit 19.0 (*)    All other components within normal limits  ETHANOL  LIPASE, BLOOD  URINALYSIS, ROUTINE W REFLEX MICROSCOPIC (NOT AT [facility])     Initial labs notable for acidosis, hyperglycemia, and with his description of symptoms, DKA seems most likely. Patient has started insulin drip.  On repeat exam the patient remains in similar condition, appearing better than his laboratory values.  Patient's labs also notable for hyperkalemia, hyponatremia, with anion gap of greater than 40. Patient continues to receive IV fluids, no new complaints, no additional vomiting.  On repeat exam the patient remains in similar condition, appears generally better, though he remains hyperglycemic MDM  Patient presents with concern of generalized weakness, nausea, vomiting. Patient is awake and alert, answering questions appropriately, afebrile. Poor, given his history of insulin-dependent diabetes his description of symptoms, DKA was an immediate consideration. This was demonstrated on labs, with notable acidosis, anion gap of greater than 40. Patient had a empiric fluids, and insulin drip provided after return of labs. Patient had no clinical deterioration, though he remained critically  ill, required admission to the stepdown unit.  CRITICAL CARE Performed by: Gerhard Munch Total critical care time: 40 Critical care time was exclusive of separately billable procedures and treating other patients. Critical care was necessary to treat or prevent imminent or life-threatening deterioration. Critical care was time spent personally by me on the following activities: development of treatment plan with patient and/or surrogate as well as nursing, discussions with consultants, evaluation of patient's response to treatment, examination of patient, obtaining history from patient or surrogate, ordering and performing treatments and interventions, ordering and review of laboratory studies, ordering  and review of radiographic studies, pulse oximetry and re-evaluation of patient's condition.   Gerhard Munch, MD 08/01/14 1055

## 2014-08-01 NOTE — ED Notes (Signed)
CBG > 600  Jeraldine LootsLockwood MD and Shanda BumpsJessica RN informed of result.

## 2014-08-01 NOTE — ED Notes (Signed)
Admitting MDs at bedside.

## 2014-08-02 DIAGNOSIS — E1010 Type 1 diabetes mellitus with ketoacidosis without coma: Secondary | ICD-10-CM

## 2014-08-02 DIAGNOSIS — Z794 Long term (current) use of insulin: Secondary | ICD-10-CM

## 2014-08-02 LAB — BASIC METABOLIC PANEL
ANION GAP: 7 (ref 5–15)
Anion gap: 9 (ref 5–15)
Anion gap: 7 (ref 5–15)
BUN: 35 mg/dL — AB (ref 6–20)
BUN: 33 mg/dL — ABNORMAL HIGH (ref 6–20)
BUN: 31 mg/dL — ABNORMAL HIGH (ref 6–20)
CALCIUM: 10.6 mg/dL — AB (ref 8.9–10.3)
CALCIUM: 10.1 mg/dL (ref 8.9–10.3)
CHLORIDE: 94 mmol/L — AB (ref 101–111)
CO2: 27 mmol/L (ref 22–32)
CO2: 29 mmol/L (ref 22–32)
CO2: 29 mmol/L (ref 22–32)
CREATININE: 1.42 mg/dL — AB (ref 0.61–1.24)
Calcium: 9.9 mg/dL (ref 8.9–10.3)
Chloride: 96 mmol/L — ABNORMAL LOW (ref 101–111)
Chloride: 99 mmol/L — ABNORMAL LOW (ref 101–111)
Creatinine, Ser: 1.65 mg/dL — ABNORMAL HIGH (ref 0.61–1.24)
Creatinine, Ser: 1.56 mg/dL — ABNORMAL HIGH (ref 0.61–1.24)
GFR calc Af Amer: 56 mL/min — ABNORMAL LOW (ref >60)
GFR calc Af Amer: 59 mL/min — ABNORMAL LOW (ref >60)
GFR calc Af Amer: >60 mL/min (ref >60)
GFR calc non Af Amer: 58 mL/min — ABNORMAL LOW (ref >60)
GFR, EST NON AFRICAN AMERICAN: 48 mL/min — AB (ref >60)
GFR, EST NON AFRICAN AMERICAN: 51 mL/min — AB (ref >60)
GLUCOSE: 114 mg/dL — AB (ref 65–99)
GLUCOSE: 166 mg/dL — AB (ref 65–99)
GLUCOSE: 138 mg/dL — AB (ref 65–99)
Potassium: 4.1 mmol/L (ref 3.5–5.1)
Potassium: 4.1 mmol/L (ref 3.5–5.1)
Potassium: 4.2 mmol/L (ref 3.5–5.1)
SODIUM: 132 mmol/L — AB (ref 135–145)
SODIUM: 135 mmol/L (ref 135–145)
Sodium: 130 mmol/L — ABNORMAL LOW (ref 135–145)

## 2014-08-02 LAB — GLUCOSE, CAPILLARY
GLUCOSE-CAPILLARY: 122 mg/dL — AB (ref 65–99)
GLUCOSE-CAPILLARY: 179 mg/dL — AB (ref 65–99)
Glucose-Capillary: 124 mg/dL — ABNORMAL HIGH (ref 65–99)
Glucose-Capillary: 156 mg/dL — ABNORMAL HIGH (ref 65–99)
Glucose-Capillary: 139 mg/dL — ABNORMAL HIGH (ref 65–99)
Glucose-Capillary: 135 mg/dL — ABNORMAL HIGH (ref 65–99)

## 2014-08-02 LAB — CALCIUM, IONIZED: Calcium, Ionized, Serum: 6.8 mg/dL — ABNORMAL HIGH (ref 4.5–5.6)

## 2014-08-02 MED ORDER — INSULIN ASPART 100 UNIT/ML ~~LOC~~ SOLN
0.0000 [IU] | Freq: Three times a day (TID) | SUBCUTANEOUS | Status: DC
  Administered 2014-08-02 (×2): 1 [IU] via SUBCUTANEOUS

## 2014-08-02 MED ORDER — INSULIN ASPART 100 UNIT/ML ~~LOC~~ SOLN
3.0000 [IU] | Freq: Three times a day (TID) | SUBCUTANEOUS | Status: DC
  Administered 2014-08-02 (×2): 3 [IU] via SUBCUTANEOUS

## 2014-08-02 MED ORDER — INSULIN ASPART 100 UNIT/ML ~~LOC~~ SOLN: 0.0000 [IU] | Freq: Every day | SUBCUTANEOUS | Status: DC

## 2014-08-02 MED ORDER — INSULIN GLARGINE 100 UNIT/ML ~~LOC~~ SOLN
8.0000 [IU] | Freq: Every day | SUBCUTANEOUS | Status: DC
  Filled 2014-08-02: qty 0.08

## 2014-08-02 MED ORDER — FAMOTIDINE 10 MG PO TABS
10.0000 mg | ORAL_TABLET | Freq: Once | ORAL | Status: AC
  Administered 2014-08-02: 10 mg via ORAL
  Filled 2014-08-02: qty 1

## 2014-08-02 MED ORDER — INSULIN GLARGINE 100 UNIT/ML ~~LOC~~ SOLN
8.0000 [IU] | Freq: Once | SUBCUTANEOUS | Status: AC
  Administered 2014-08-02: 8 [IU] via SUBCUTANEOUS
  Filled 2014-08-02: qty 0.08

## 2014-08-02 NOTE — Progress Notes (Signed)
Inpatient Diabetes Program Recommendations  AACE/ADA: New Consensus Statement on Inpatient Glycemic Control (2013)  Target Ranges:  Prepandial:   less than 140 mg/dL      Peak postprandial:   less than 180 mg/dL (1-2 hours)      Critically ill patients:  140 - 180 mg/dL   Reason for Visit: Admitted with DKA.   Diabetes history: Type 1 diabetes>40 years. Outpatient Diabetes medications: Lantus 12 units q HS, Novolog 1 unit for every 10 grams of CHO, he also corrects CBG's Current orders for Inpatient glycemic control:  Note patient transitioned off insulin drip to Lantus 8 units. Novolog sensitive tid with meals and HS, Novolog 3 units tid with meals.  Spoke with patient at length regarding diabetes management.  States he does not see an endocrinologist.  He see's NP/CDE-Teresa Dareen PianoAnderson.  He states that he has been on an insulin pump for the past 9-10 years.  Recently it was malfunctioning and when he called manufacturer, the [employer] customer service rep. Told him he was due for an upgrade.  He is currently in the process of getting new pump.  He thinks that he likely got dehydrated and just could not stay ahead with his fluids which made him go into DKA.   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  Encouraged him to monitor CBG's closely over the next few days and stay hydrated due to DKA event.  He is hoping to go home today.  Discussed with RN.   Thanks, Beryl MeagerJenny Kaysi Ourada, RN, BC-ADM Inpatient Diabetes Coordinator Pager [phone] (8a-5p)

## 2014-08-02 NOTE — Progress Notes (Signed)
Pt complaining of heartburn. Rec'd order for one time dose famotidine

## 2014-08-02 NOTE — Discharge Instructions (Signed)
Please continue to drink plenty of water after returning home. Continue home insulin with carbohydrate counting, please contact your PCP if your nausea and vomiting return as before. You have a follow up appointment with Jonathan Colon on 7/15. Please attend this appointment to ensure your recovery has continued. You may also ask about additional help with obtaining/setting up your insulin pump at that time.

## 2014-08-02 NOTE — Progress Notes (Signed)
Subjective: No acute events o/n. Confusion, SOB and abdominal pain had resolved by this morning. He continues to feel slight nausea and decreased appetite, but ate more at lunch than breakfast. Patient started drinking more water and using bedside urinal during the night.  Objective: Vital signs in last 24 hours: Filed Vitals:   08/02/14 1152 08/02/14 1200 08/02/14 1300 08/02/14 1400  BP: 120/64 139/61 139/70 131/68  Pulse: 95   101  Temp: 97.8 F (36.6 C)     TempSrc: Oral     Resp: Height:      Weight:      SpO2: 98%      Weight change:   Intake/Output Summary (Last 24 hours) at 08/02/14 1630 Last data filed at 08/02/14 1028  Gross per 24 hour  Intake 1824.82 ml  Output   1000 ml  Net 824.82 ml   GENERAL- alert, co-operative, NAD HEENT- PERRL, EOMI, oral mucosa appears moist CARDIAC- RRR, no murmurs, rubs or gallops RESP- Moving equal volumes of air, and clear to auscultation bilaterally, no wheezes or crackles ABDOMEN- Soft, nontender, no guarding or rebound EXTREMITIES- pulse 2+, symmetric, no pedal edema. SKIN- Warm, dry PSYCH- Normal mood and affect, appropriate thought content and speech  Lab Results: Basic Metabolic Panel:  Recent Labs Lab 08/02/14 0440 08/02/14 1301  NA 130* 135  K 4.1 4.2  CL 94* 99*  CO2 29 29  GLUCOSE 166* 138*  BUN 33* 31*  CREATININE 1.56* 1.42*  CALCIUM 9.9 10.1   Liver Function Tests:  Recent Labs Lab 08/01/14 0958  AST 55*  ALT 55  ALKPHOS 70  BILITOT 2.6*  PROT 7.3  ALBUMIN 4.1    Recent Labs Lab 08/01/14 0958  LIPASE 750*   CBC:  Recent Labs Lab 08/01/14 0958  WBC 12.1*  NEUTROABS 10.0*  HGB 13.4  HCT 40.0  MCV 100.5*  PLT 245   CBG:  Recent Labs Lab 08/02/14 0055 08/02/14 0200 08/02/14 0304 08/02/14 0435 08/02/14 0759 08/02/14 1152  GLUCAP 122* 124* 156* 179* 139* 135*   Urine Drug Screen: Drugs of Abuse     Component Value Date/Time   LABOPIA NONE DETECTED 07/09/2011  1937   COCAINSCRNUR NONE DETECTED 07/09/2011 1937   LABBENZ NONE DETECTED 07/09/2011 1937   AMPHETMU NONE DETECTED 07/09/2011 1937   THCU NONE DETECTED 07/09/2011 1937   LABBARB NONE DETECTED 07/09/2011 1937    Alcohol Level:  Recent Labs Lab 08/01/14 0958  ETH <5   Urinalysis:  Recent Labs Lab 08/01/14 1017  COLORURINE YELLOW  LABSPEC 1.019  PHURINE 5.0  GLUCOSEU >1000*  HGBUR NEGATIVE  BILIRUBINUR SMALL*  KETONESUR 40*  PROTEINUR 30*  UROBILINOGEN 0.2  NITRITE NEGATIVE  LEUKOCYTESUR NEGATIVE    Micro Results: Recent Results (from the past 240 hour(s))  MRSA PCR Screening     Status: Abnormal   Collection Time: 08/01/14  1:33 PM  Result Value Ref Range Status   MRSA by PCR POSITIVE (A) NEGATIVE Final    Comment:        The GeneXpert MRSA Assay (FDA approved for NASAL specimens only), is one component of a comprehensive MRSA colonization surveillance program. It is not intended to diagnose MRSA infection nor to guide or monitor treatment for MRSA infections. RESULT CALLED TO, READ BACK BY AND VERIFIED WITH: L. FLOOD RN 15:50 08/01/14 ([email or web address])    Studies/Results: Dg Chest 2 View  08/01/2014   CLINICAL DATA:  Two day history of shortness of breath.  Diabetic ketoacidosis.  EXAM: CHEST  2 VIEW  COMPARISON:  January 23, 2012  FINDINGS: Lungs are clear. Heart size and pulmonary vascularity are normal. No adenopathy. No bone lesions. There is an azygos lobe on the right, an anatomic variant.  IMPRESSION: No edema or consolidation.   Electronically Signed   By: Bretta BangWilliam  Woodruff III M.D.   On: 08/01/2014 12:36   Medications: I have reviewed the patient's current medications. Scheduled Meds: . heparin  5,000 Units Subcutaneous 3 times per day  . insulin aspart  0-5 Units Subcutaneous QHS  . insulin aspart  0-9 Units Subcutaneous TID WC  . insulin aspart  3 Units Subcutaneous TID WC  . insulin glargine  8 Units Subcutaneous QHS   Continuous Infusions: . sodium  chloride Stopped (08/01/14 2137)   PRN Meds:. Assessment/Plan: Principal Problem:   DKA (diabetic ketoacidoses) Active Problems:   HTN (hypertension)   HLD (hyperlipidemia)   Dyspnea   Hypercalcemia   Indigestion   AKI (acute kidney injury) Diabetic Ketoacidosis with lactic acidosis: Patient was admitted 7/6 with glucose 847, CO2 of 7, AG of 40, ketones on UA and lactic acid of 7.09. Anion gap closed o/n, lactic acid improved to 2.8 by 7/7 AM. No K replacement required, lowest reading was at 4.1. [facility] Now awake and tolerating diet, feels near baseline. His DKA was preceded by at least 4-5 days of nausea and infrequent vomiting that resulted in very poor PO intake and low administration of bolus dose insulin. His home regimen was reportedly 10-12U lantus qhs and 10-17U aspart TIDWC, but says he was confused and cannot recall what dose he used for 2-3 days before admission. - Transition to only PO fluids, meds - Patient has clinically improved to near his baseline, and can continue to manage his insulin laboratory studies that correspond to this.  Abdominal pain: epigastric pain that has improved since admission. He reports intermittently taking PPI for treatment but recently only taking TUMs frequently, likely related to his brief hypercalcemia on admission. History suggest likely GERD or other underlying GI etiology. - Protonix 40mg  IV once - Pepcid 10mg  PO  Dyspnea: resolved o/n  AKI: Admitted with Cr >3.1, with a baseline 1-1.3 from previous studies. Consistently trending downwards with hydration and resolution of DKA. - Continue encouraging PO fluid intake  Hypertension: normotensive on admission, likely related to volume depletion from DKA - Home meds held during admission, resume at d/c  FULL CODE FEN carb modified diet VTE ppx [place] heparin  Dispo: Planned discharge to home today PM on home medications.  The patient does have a current PCP  Elizabeth Palau(Teresa Anderson, FNP) and has a follow up appointment 08/10/14.  The patient does not have transportation limitations that hinder transportation to clinic appointments.  .Services Needed at time of discharge: Y = Yes, Blank = No PT: N  OT: N  RN: N  Equipment: N  Other: N    LOS: 1 day   Fuller Planhristopher W Fielding Mault, MD 08/02/2014, 4:30 PM

## 2014-08-03 NOTE — Discharge Summary (Signed)
Name: Jonathan Colon MRN: [id number] DOB: [birth year] 49 y.o. PCP: Elizabeth Palau, FNP  Date of Admission: 08/01/2014  9:31 AM Date of Discharge: 08/03/2014 Attending Physician: No att. providers found  Discharge Diagnosis: Principal Problem:   DKA (diabetic ketoacidoses) Active Problems:   HTN (hypertension)   HLD (hyperlipidemia)   Hypercalcemia   Indigestion   AKI (acute kidney injury)  Discharge Medications:   Medication List    TAKE these medications        acetaminophen 500 MG tablet  Commonly known as:  TYLENOL  Take 500 mg by mouth every 6 (six) hours as needed. For pain     amLODipine-benazepril 10-20 MG per capsule  Commonly known as:  LOTREL  Take 1 capsule by mouth daily.     atorvastatin 80 MG tablet  Commonly known as:  LIPITOR  Take 80 mg by mouth daily.     calcium carbonate 500 MG chewable tablet  Commonly known as:  TUMS - dosed in mg elemental calcium  Chew 2 tablets by mouth daily.     esomeprazole 40 MG capsule  Commonly known as:  NEXIUM  Take 40 mg by mouth daily before breakfast.     ibuprofen 800 MG tablet  Commonly known as:  ADVIL,MOTRIN  Take 800 mg by mouth every 8 (eight) hours as needed. For pain     insulin aspart 100 UNIT/ML injection  Commonly known as:  novoLOG  Inject 2-10 Units into the skin 3 (three) times daily before meals. 2 units in the morning, 5 units in the afternoon and 10 units at night     insulin glargine 100 UNIT/ML injection  Commonly known as:  LANTUS  Inject 12 Units into the skin at bedtime.     PAXIL PO  Take 40 mg by mouth daily.        Disposition and follow-up:   Jonathan Colon was discharged from [facility] in Good condition.  At the hospital follow up visit please address:  1.  Type 1 Diabetes Mellitus: Patient went into DKA secondary after nausea and vomiting reduced PO intake and challenged his bolus insulin management. He was previously highly successful with a medtronic  insulin pump, but was waiting for insurance reasons to obtain a new pump. Please check is he has further trouble with insulin dosing or the process replacing his pump.  2.  Labs / imaging needed at time of follow-up: none  3.  Pending labs/ test needing follow-up: none  Follow-up Appointments:     Follow-up Information    Follow up with Elizabeth Palau, FNP. Go in 8 days.   Specialty:  Nurse Practitioner   Why:  Reevaluate GI symtoms, glucose control, insulin pump needs   Contact information:   [address] [place] [address] [phone]       Discharge Instructions: Discharge Instructions    Diet Carb Modified    Complete by:  As directed            Procedures Performed:  Dg Chest 2 View  08/01/2014   CLINICAL DATA:  Two day history of shortness of breath. Diabetic ketoacidosis.  EXAM: CHEST  2 VIEW  COMPARISON:  January 23, 2012  FINDINGS: Lungs are clear. Heart size and pulmonary vascularity are normal. No adenopathy. No bone lesions. There is an azygos lobe on the right, an anatomic variant.  IMPRESSION: No edema or consolidation.   Electronically Signed   By: Bretta Bang III  M.D.   On: 08/01/2014 12:36    Admission HPI: Jonathan Colon is a 49 yo male with PMH of T1DM, HTN, HLD, and multiple episodes of DKA. He reports that over a week ago he dropped his medtronic insulin pump and it broke. He has been working for the past week or so with his PCP to get a new meter but reports that his insurance has not yet approved it. He has been trying to manage his diabetes with multiple daily injections but reports he has been doing a poor job and notes that his sugars have frequently been in the 300s-400s but also at times has been in 100s. He notes he has been using 10-12 units of lantus in the morning, 10-15 units of Novolog with breakfast, 5-8 units of Novolog with lunch, and 10-17 units of Novolog with dinner. However for the past two days he reports  increase nausea, vomiting and some abdominal discomfort (attributes to vomiting.) He denies any fever, admits some chills, denies any diarrhea or vomiting, denies dysuria.  Hospital Course by problem list: DKA (diabetic ketoacidoses): On admission vitals BP 132/45 Pulse 122 Resp.Rate 21 SpO2 and subjectively short of breath with significant abdominal pain. CBG of 847, blood showing, hyperkalemia, lactic acidosis, ketonurina, glucosuria, and anion gap of 40. He was fluid resuscitated 2L bolus and 1150mL/hr IVNS. He was placed on insuling gtt with repeat Bmet q4hrs until his anion gap resolved o/n and he was transitioned to [place] insulin. Lactic acidosis continued trending downward to 2.8 on 7/7 AM and patient was discharged in the afternoon, with follow up appointment on 7/15.  HTN (hypertension): Medications held during admission due to severe dehydration  Dyspnea: Chest xray was obtained on 7/6 to rule out acute cardiopulmonary process. No abnormalities found and symptoms ended with resolution of his DKA.  Indigestion: Abdominal pain improved with GI cocktail and protonix then resolution of DKA. Ca 12.8 on admission was most likely 2/2 extensive TUMs tablet use and corrected after stopping these.  AKI (acute kidney injury): Prerenal azotemia due to volume depletion in DKA. Improved with fluid resuscitation.  Discharge Vitals:   BP 131/68 mmHg  Pulse 101  Temp(Src) 97.8 F (36.6 C) (Oral)  Resp 15  Ht 5\' 8"  (1.727 m)  Wt 70.1 kg (154 lb 8.7 oz)  BMI 23.50 kg/m2  SpO2 98%  Signed: Fuller Planhristopher W Orlanda Lemmerman, MD 08/03/2014, 6:35 PM

## 2014-08-15 ENCOUNTER — Encounter (HOSPITAL_COMMUNITY): Payer: Self-pay | Admitting: *Deleted

## 2014-08-20 ENCOUNTER — Emergency Department (HOSPITAL_COMMUNITY)
Admission: EM | Admit: 2014-08-20 | Discharge: 2014-08-20 | Disposition: A | Discharge status: Home / Self Care | Payer: BLUE CROSS/BLUE SHIELD | Attending: Emergency Medicine | Admitting: Emergency Medicine

## 2014-08-20 ENCOUNTER — Encounter (HOSPITAL_COMMUNITY): Payer: Self-pay | Admitting: Emergency Medicine

## 2014-08-20 ENCOUNTER — Emergency Department (HOSPITAL_COMMUNITY): Payer: BLUE CROSS/BLUE SHIELD

## 2014-08-20 DIAGNOSIS — K219 Gastro-esophageal reflux disease without esophagitis: Secondary | ICD-10-CM | POA: Insufficient documentation

## 2014-08-20 DIAGNOSIS — Z794 Long term (current) use of insulin: Secondary | ICD-10-CM | POA: Insufficient documentation

## 2014-08-20 DIAGNOSIS — E782 Mixed hyperlipidemia: Secondary | ICD-10-CM | POA: Diagnosis not present

## 2014-08-20 DIAGNOSIS — R000 Tachycardia, unspecified: Secondary | ICD-10-CM | POA: Insufficient documentation

## 2014-08-20 DIAGNOSIS — F329 Major depressive disorder, single episode, unspecified: Secondary | ICD-10-CM | POA: Diagnosis not present

## 2014-08-20 DIAGNOSIS — E1310 Other specified diabetes mellitus with ketoacidosis without coma: Secondary | ICD-10-CM | POA: Diagnosis not present

## 2014-08-20 DIAGNOSIS — Z79899 Other long term (current) drug therapy: Secondary | ICD-10-CM | POA: Diagnosis not present

## 2014-08-20 DIAGNOSIS — R1011 Right upper quadrant pain: Secondary | ICD-10-CM | POA: Insufficient documentation

## 2014-08-20 DIAGNOSIS — I10 Essential (primary) hypertension: Secondary | ICD-10-CM | POA: Diagnosis not present

## 2014-08-20 LAB — COMPREHENSIVE METABOLIC PANEL
ALT: 110 U/L — ABNORMAL HIGH (ref 17–63)
AST: 279 U/L — AB (ref 15–41)
Albumin: 3.9 g/dL (ref 3.5–5.0)
Alkaline Phosphatase: 72 U/L (ref 38–126)
Anion gap: 15 (ref 5–15)
BUN: <5 mg/dL — ABNORMAL LOW (ref 6–20)
CHLORIDE: 99 mmol/L — AB (ref 101–111)
CO2: 23 mmol/L (ref 22–32)
Calcium: 9.5 mg/dL (ref 8.9–10.3)
Creatinine, Ser: 1.11 mg/dL (ref 0.61–1.24)
GFR calc Af Amer: >60 mL/min (ref >60)
GFR calc non Af Amer: >60 mL/min (ref >60)
Glucose, Bld: 37 mg/dL — CL (ref 65–99)
POTASSIUM: 4.3 mmol/L (ref 3.5–5.1)
Sodium: 137 mmol/L (ref 135–145)
Total Bilirubin: 1.4 mg/dL — ABNORMAL HIGH (ref 0.3–1.2)
Total Protein: 6.9 g/dL (ref 6.5–8.1)

## 2014-08-20 LAB — URINALYSIS, ROUTINE W REFLEX MICROSCOPIC
Bilirubin Urine: NEGATIVE
Glucose, UA: 100 mg/dL — AB
HGB URINE DIPSTICK: NEGATIVE
Ketones, ur: NEGATIVE mg/dL
Leukocytes, UA: NEGATIVE
NITRITE: NEGATIVE
PH: 5.5 (ref 5.0–8.0)
Protein, ur: 30 mg/dL — AB
SPECIFIC GRAVITY, URINE: 1.003 — AB (ref 1.005–1.030)
Urobilinogen, UA: 0.2 mg/dL (ref 0.0–1.0)

## 2014-08-20 LAB — CBC WITH DIFFERENTIAL/PLATELET
BASOS ABS: 0.1 10*3/uL (ref 0.0–0.1)
Basophils Relative: 1 % (ref 0–1)
EOS ABS: 0.1 10*3/uL (ref 0.0–0.7)
EOS PCT: 2 % (ref 0–5)
HCT: 42.5 % (ref 39.0–52.0)
Hemoglobin: 15 g/dL (ref 13.0–17.0)
Lymphocytes Relative: 22 % (ref 12–46)
Lymphs Abs: 1.2 10*3/uL (ref 0.7–4.0)
MCH: 34 pg (ref 26.0–34.0)
MCHC: 35.3 g/dL (ref 30.0–36.0)
MCV: 96.4 fL (ref 78.0–100.0)
Monocytes Absolute: 0.5 10*3/uL (ref 0.1–1.0)
Monocytes Relative: 9 % (ref 3–12)
NEUTROS PCT: 66 % (ref 43–77)
Neutro Abs: 3.6 10*3/uL (ref 1.7–7.7)
Platelets: 197 10*3/uL (ref 150–400)
RBC: 4.41 MIL/uL (ref 4.22–5.81)
RDW: 12.2 % (ref 11.5–15.5)
WBC: 5.5 10*3/uL (ref 4.0–10.5)

## 2014-08-20 LAB — URINE MICROSCOPIC-ADD ON

## 2014-08-20 LAB — LIPASE, BLOOD: Lipase: 39 U/L (ref 22–51)

## 2014-08-20 LAB — CBG MONITORING, ED
GLUCOSE-CAPILLARY: 45 mg/dL — AB (ref 65–99)
GLUCOSE-CAPILLARY: 254 mg/dL — AB (ref 65–99)

## 2014-08-20 MED ORDER — ONDANSETRON HCL 4 MG/2ML IJ SOLN
4.0000 mg | Freq: Once | INTRAMUSCULAR | Status: AC
  Administered 2014-08-20: 4 mg via INTRAVENOUS
  Filled 2014-08-20: qty 2

## 2014-08-20 MED ORDER — SODIUM CHLORIDE 0.9 % IV BOLUS (SEPSIS)
1000.0000 mL | Freq: Once | INTRAVENOUS | Status: AC
  Administered 2014-08-20: 1000 mL via INTRAVENOUS

## 2014-08-20 MED ORDER — DEXTROSE 50 % IV SOLN
1.0000 | Freq: Once | INTRAVENOUS | Status: AC
  Administered 2014-08-20: 50 mL via INTRAVENOUS
  Filled 2014-08-20: qty 50

## 2014-08-20 MED ORDER — MORPHINE SULFATE 4 MG/ML IJ SOLN: 4.0000 mg | Freq: Once | INTRAMUSCULAR | Status: DC

## 2014-08-20 NOTE — ED Provider Notes (Signed)
CSN: [id number]     Arrival date & time 08/20/14  1309 History   First MD Initiated Contact with Patient 08/20/14 1730     Chief Complaint  Patient presents with  . Abdominal Pain    right side lateral pain     (Consider location/radiation/quality/duration/timing/severity/associated sxs/prior Treatment) HPI   49 year old male with history of type 1 diabetes, prior history of DKA, hypertension, hypercholesterolemia presenting for evaluation of abdominal pain. Patient reports intermittent recurrent right upper quadrant pain for the past several weeks worsened in the past 3 days. Describe pain as sharp stabbing worsening with eating. Endorse nausea and has vomited once today but no diarrhea. Also reported having chills. Symptoms worsened with eating fried greasy fast food and symptoms are with position change. The pain is causing him to have anxiety. Did had a bowel movement today with small amount no blood or mucus. Able to pass flatus. When pain since this significant he also endorsed pleuritic chest pain. Pain is currently 2 of 10 but more intense when moving. He is concern for gallbladder disease. He denies having shortness of breath, productive cough, back pain, dysuria, hematuria, or rash. 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 Patient felt that this symptoms is different from his usual DKA. He is unable to take his medication today as evidenced by his high blood pressure. He was unable to tolerates Cracker and Clear Broth Yesterday.      Past Medical History  Diagnosis Date  . Hypertension   . Hypercholesteremia   . Depression   . Diabetes mellitus     has current insulin pump  . DKA (diabetic ketoacidoses)   . DKA (diabetic ketoacidoses) 08/01/2014  . GERD (gastroesophageal reflux disease)    Past Surgical History  Procedure Laterality Date  . Eye surgery      1992  . Vasectomy     No family history on file. History  Substance Use  Topics  . Smoking status: Never Smoker   . Smokeless tobacco: Never Used  . Alcohol Use: Yes     Comment: QUIT DRINKING 6 MONTHS AGO     Review of Systems  All other systems reviewed and are negative.     Allergies  Review of patient's allergies indicates no known allergies.  Home Medications   Prior to Admission medications   Medication Sig Start Date End Date Taking? Authorizing Provider  amLODipine-benazepril (LOTREL) 10-20 MG per capsule Take 1 capsule by mouth daily.   Yes Historical Provider, MD  atorvastatin (LIPITOR) 80 MG tablet Take 80 mg by mouth daily at 6 PM.    Yes Historical Provider, MD  famotidine (PEPCID) 10 MG tablet Take 10 mg by mouth as needed for heartburn or indigestion.   Yes Historical Provider, MD  insulin aspart (NOVOLOG) 100 UNIT/ML injection Inject 2-10 Units into the skin 3 (three) times daily before meals. Takes 1 unit of insulin for every 20g of carbs   Yes Historical Provider, MD  insulin glargine (LANTUS) 100 UNIT/ML injection Inject 12 Units into the skin at bedtime.   Yes Historical Provider, MD  PARoxetine HCl (PAXIL PO) Take 40 mg by mouth daily.    Yes Historical Provider, MD  esomeprazole (NEXIUM) 40 MG capsule Take 40 mg by mouth daily before breakfast.    Historical Provider, MD   BP 178/91 mmHg  Pulse 110  Temp(Src) 97.9 F (36.6 C) (Oral)  Resp 24  SpO2 100% Physical Exam  Constitutional: He appears well-developed and well-nourished. No distress.  HENT:  Head: Atraumatic.  Eyes: Conjunctivae are normal.  Neck: Neck supple.  Cardiovascular:  Tachycardia without murmurs rubs or gallops.  Pulmonary/Chest: Effort normal and breath sounds normal. No respiratory distress. He has no wheezes. He exhibits tenderness (Mild tenderness to right lower lateral ribs on palpation without crepitus or emphysema.).  Abdominal: Soft. Bowel sounds are normal. He exhibits no distension. There is tenderness (Right upper quadrant tenderness on palpation  without guarding or rebound tenderness. Negative Murphy sign, no pain at McBurney's point.).  Neurological: He is alert.  Skin: No rash noted.  Psychiatric: He has a normal mood and affect.  Nursing note and vitals reviewed.   ED Course  Procedures (including critical care time)  Patient here with recurring right upper quadrant abdominal pain, postprandial pain and concerns for possible biliary disease. Workup initiated, pain medication offer however patient does not want any narcotic pain medication. Plan to obtain abdominal ultrasound for further evaluation. Patient is hypertensive with blood pressure of 178/91 however he has not been taking his blood pressure medication today. No headache chest pain or signs to suggest cardiac disease.  9:19 PM Abdominal ultrasound showing no evidence of biliary disease or acute finding. He does have evidence of transaminitis with AST 279, ALT 110 and total bili of 1.4. I discussed this finding with patient and he states he has had recurrent elevated liver enzyme in the past relating to taking Lipitor. He did resume taking Lipitor a week ago. Patient is made aware of this finding and recommended to stop his medication and follow-up with his doctor. Since patient hasn't been able to eat and drink as usual, today his glucose was 37. He did not have any evidence of altered mental status. We will feed him and will recheck his CBG.  10:22 PM CBG improves after patient have eaten and take D50. He is able to tolerate by mouth without difficulty. He agrees to follow-up with PCP for further management of his condition. Return precautions discussed.  Labs Review Labs Reviewed  COMPREHENSIVE METABOLIC PANEL - Abnormal; Notable for the following:    Chloride 99 (*)    Glucose, Bld 37 (*)    BUN <5 (*)    AST 279 (*)    ALT 110 (*)    Total Bilirubin 1.4 (*)    All other components within normal limits  URINALYSIS, ROUTINE W REFLEX MICROSCOPIC (NOT AT [facility]) -  Abnormal; Notable for the following:    Specific Gravity, Urine 1.003 (*)    Glucose, UA 100 (*)    Protein, ur 30 (*)    All other components within normal limits  CBG MONITORING, ED - Abnormal; Notable for the following:    Glucose-Capillary 45 (*)    All other components within normal limits  CBG MONITORING, ED - Abnormal; Notable for the following:    Glucose-Capillary 254 (*)    All other components within normal limits  CBC WITH DIFFERENTIAL/PLATELET  LIPASE, BLOOD  URINE MICROSCOPIC-ADD ON    Imaging Review US Abdomen Complete  08/20/2014   CLINICAL DATA:  Right upper quadrant pain  EXAM: ULTRASOUND ABDOMEN COMPLETE  COMPARISON:  None.  FINDINGS: Gallbladder: No gallstones or wall thickening visualized. No sonographic Murphy sign noted.  Common bile duct: Diameter: 4 mm  Liver: No focal lesion identified. Within normal limits in parenchymal echogenicity.  IVC: No abnormality visualized.  Pancreas: Visualized portion unremarkable.  Spleen: Size and appearance within normal limits.  Right Kidney: Length: 9.5 cm. Echogenicity within normal limits. No mass or hydronephrosis visualized.  Left Kidney: Length: 9.4 cm. Echogenicity within normal limits. No mass or hydronephrosis visualized. Simple cyst in the upper pole of the left kidney measures 2.1 cm.  Abdominal aorta: No aneurysm visualized.  Other findings: None.  IMPRESSION: No acute intra-abdominal pathology.   Electronically Signed   By: Jolaine Click M.D.   On: 08/20/2014 20:30     EKG Interpretation None      MDM   Final diagnoses:  RUQ abdominal pain    BP 165/75 mmHg  Pulse 100  Temp(Src) 97.9 F (36.6 C) (Oral)  Resp 18  SpO2 98%  I have reviewed nursing notes and vital signs. I personally viewed the imaging tests through PACS system and agrees with radiologist's intepretation I reviewed available ER/hospitalization records through the EMR     Fayrene Helper, PA-C 08/20/14 2223  Purvis Sheffield, MD 08/21/14  0005

## 2014-08-20 NOTE — ED Notes (Addendum)
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

## 2014-08-20 NOTE — ED Notes (Signed)
Pt. Stated, I have pain on the right lateral side and it seems like its getting worse.

## 2014-08-20 NOTE — Discharge Instructions (Signed)
You have been evaluated for your upper abdominal pain. No evidence of gallbladder disease evaluation. Please take your diabetic medication as prescribed and follow up closely with the Dr. for further care. Return if the condition worsen or if you have any other concern.  Abdominal Pain Many things can cause belly (abdominal) pain. Most times, the belly pain is not dangerous. Many cases of belly pain can be watched and treated at home. HOME CARE   Do not take medicines that help you go poop (laxatives) unless told to by your doctor.  Only take medicine as told by your doctor.  Eat or drink as told by your doctor. Your doctor will tell you if you should be on a special diet. GET HELP IF:  You do not know what is causing your belly pain.  You have belly pain while you are sick to your stomach (nauseous) or have runny poop (diarrhea).  You have pain while you pee or poop.  Your belly pain wakes you up at night.  You have belly pain that gets worse or better when you eat.  You have belly pain that gets worse when you eat fatty foods.  You have a fever. GET HELP RIGHT AWAY IF:   The pain does not go away within 2 hours.  You keep throwing up (vomiting).  The pain changes and is only in the right or left part of the belly.  You have bloody or tarry looking poop. MAKE SURE YOU:   Understand these instructions.  Will watch your condition.  Will get help right away if you are not doing well or get worse. Document Released: 07/01/2007 Document Revised: 01/17/2013 Document Reviewed: 09/21/2012 [facility] Patient Information 2015 [place], [place]. This information is not intended to replace advice given to you by your health care provider. Make sure you discuss any questions you have with your health care provider.

## 2016-06-04 IMAGING — US US ABDOMEN COMPLETE
1 series · 14 of 25 positions shown · non-contrast
Comparison: None.

CLINICAL DATA: Right upper quadrant pain

EXAM:
ULTRASOUND ABDOMEN COMPLETE

[Series 1: us abdomen complete · 0.24mm/px · 14 of 67 slices shown]
[im 1/67]
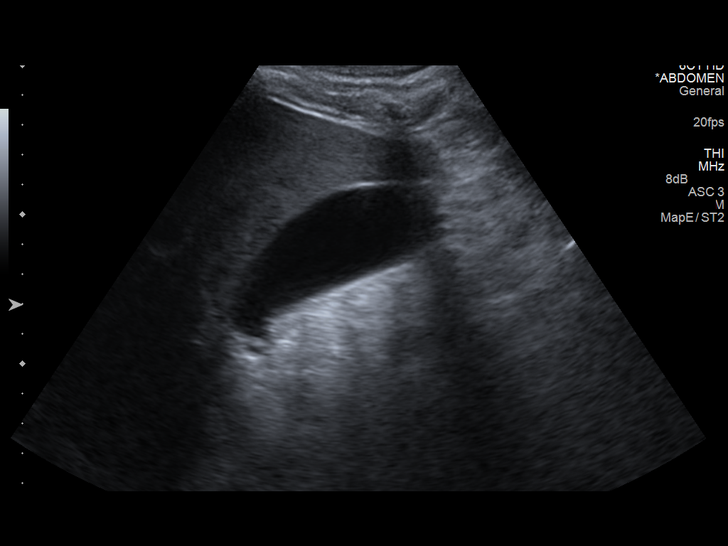
[im 6/67]
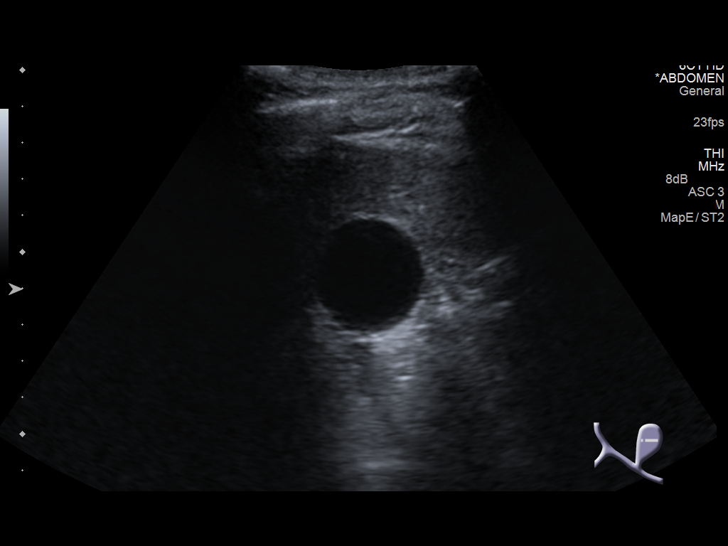
[im 12/67]
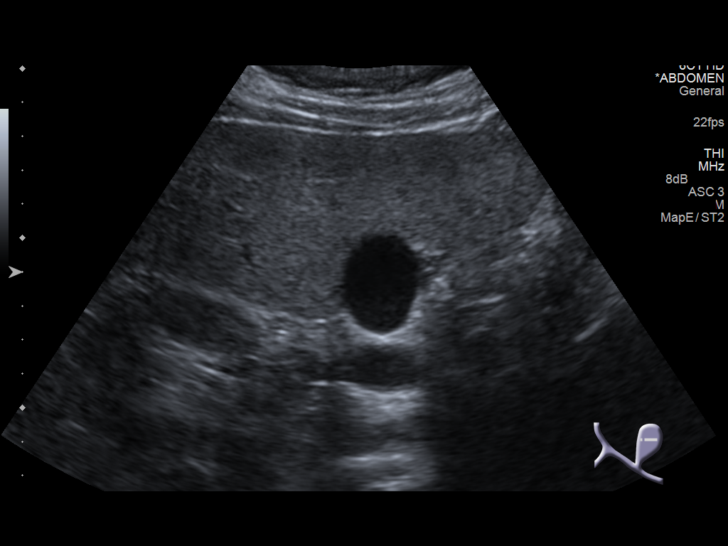
[im 17/67]
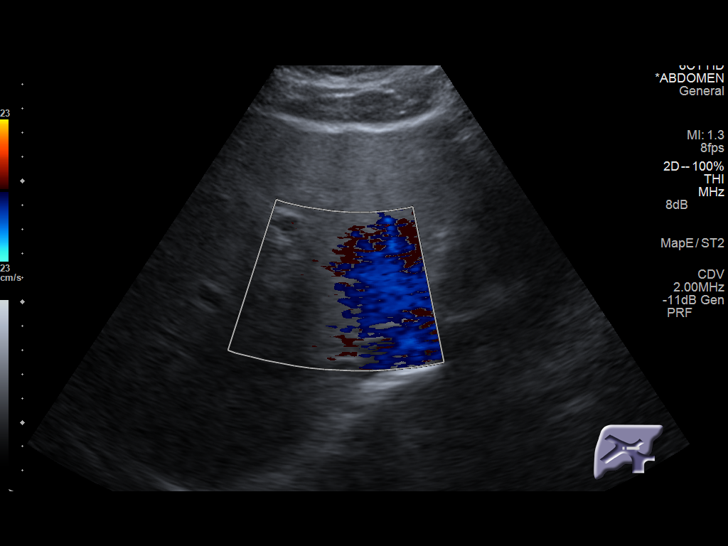
[im 23/67]
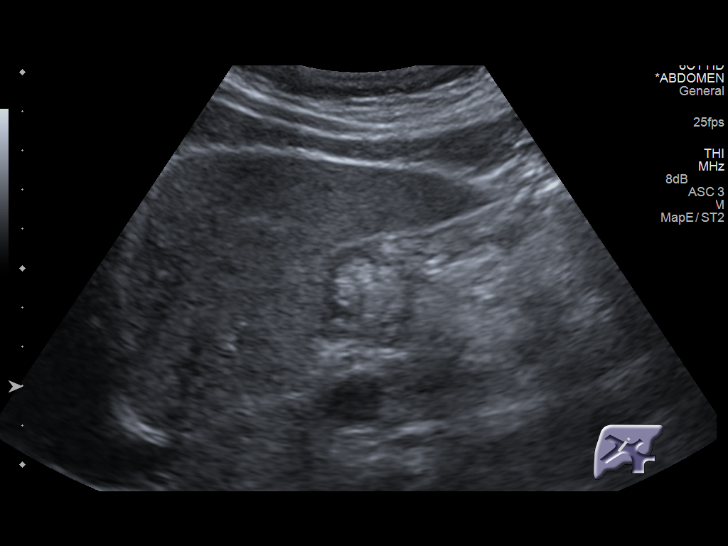
[im 25/67]
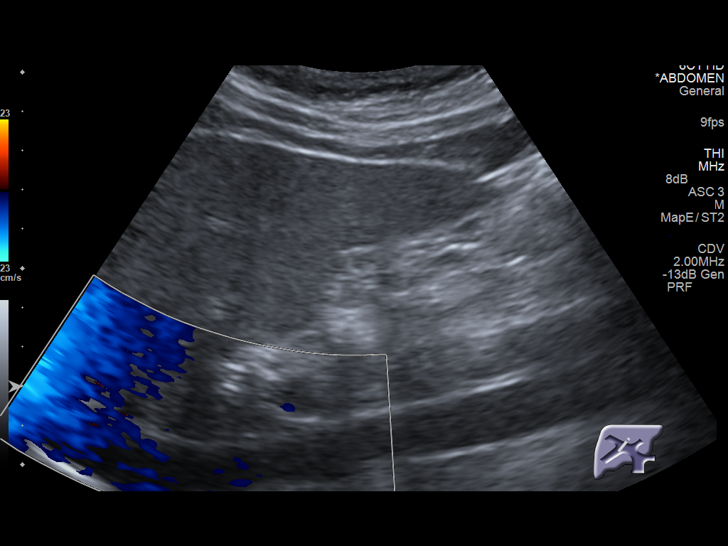
[im 31/67]
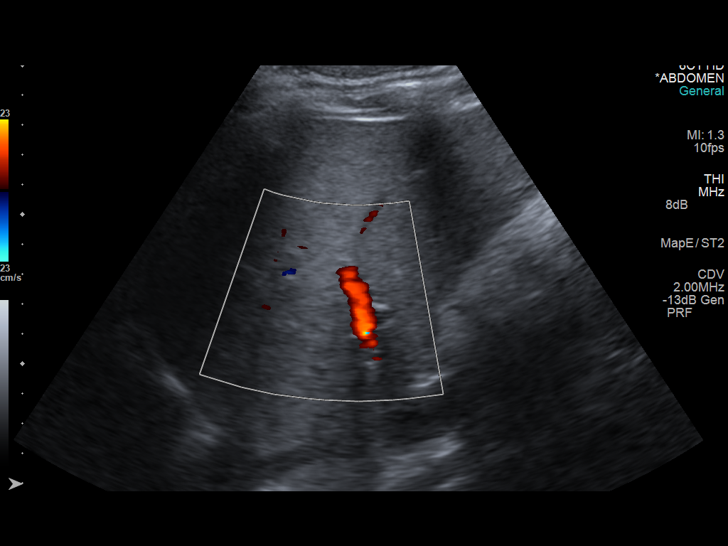
[im 36/67]
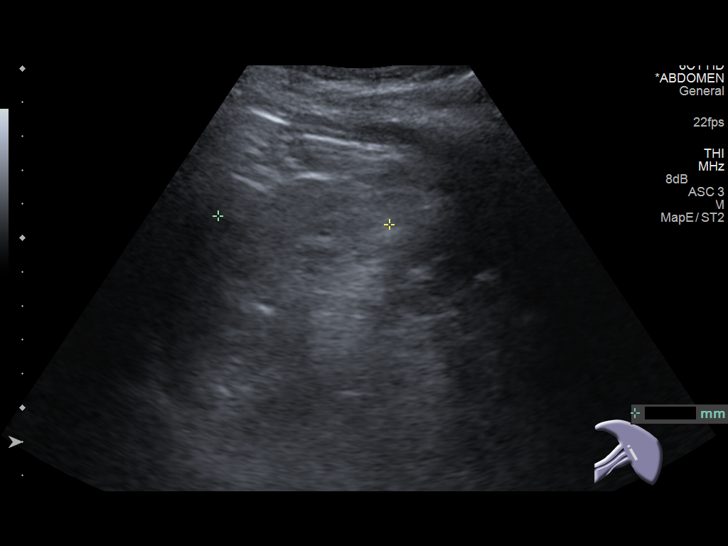
[im 42/67]
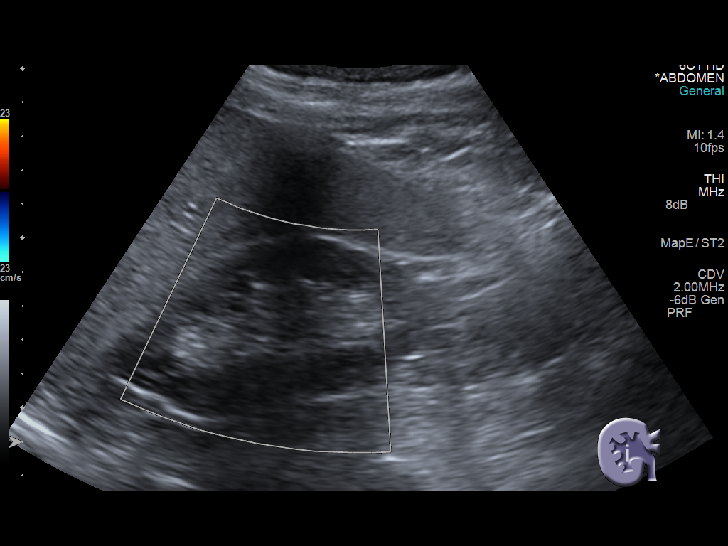
[im 45/67]
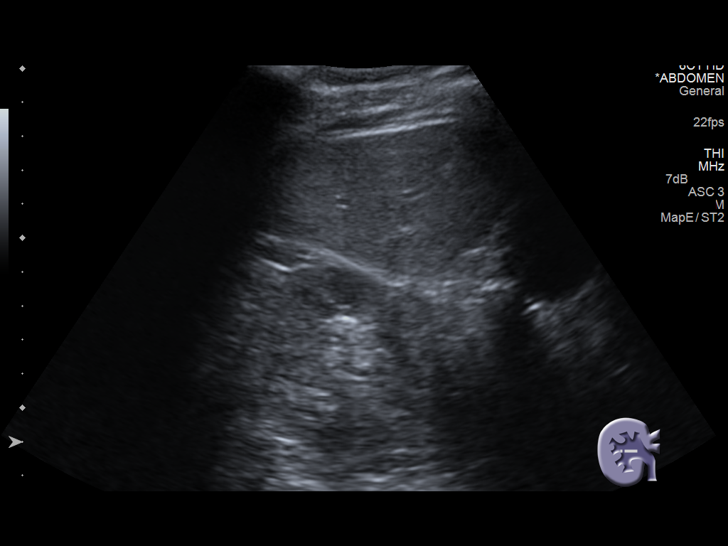
[im 50/67]
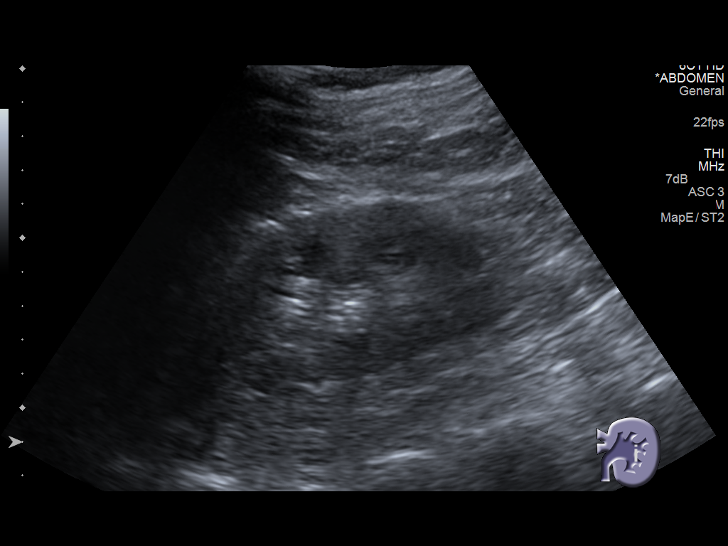
[im 56/67]
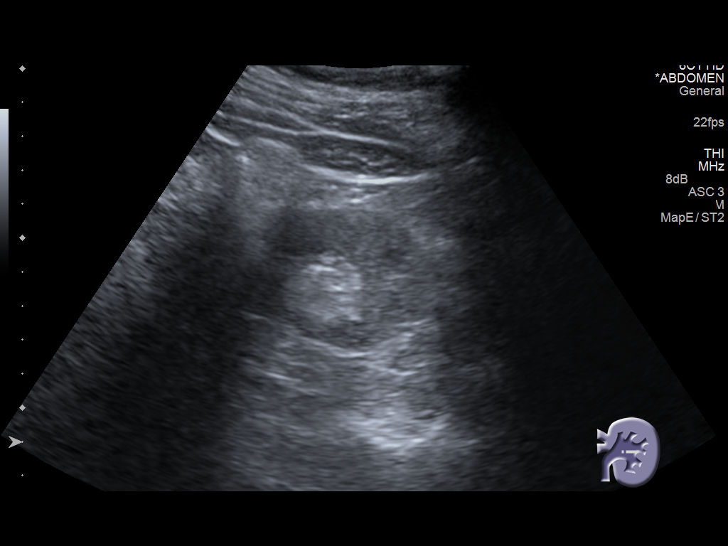
[im 61/67]
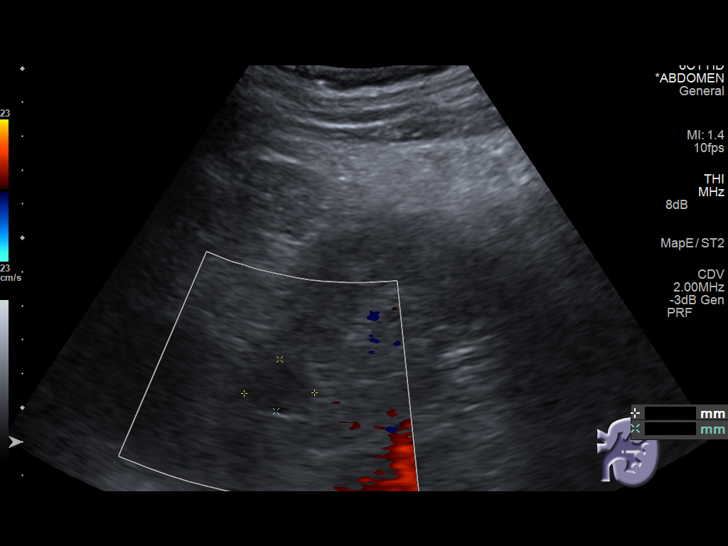
[im 67/67]
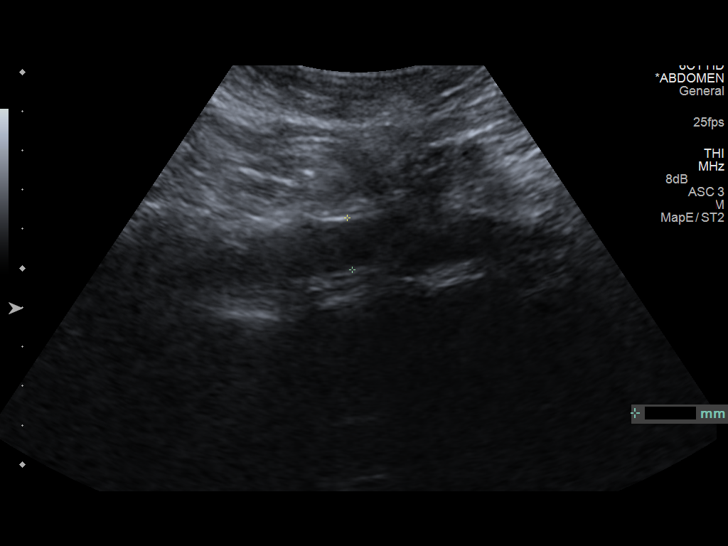

[14 of 25 positions shown; findings below may reference images not displayed]

FINDINGS: Gallbladder: No gallstones or wall thickening visualized. No
sonographic Murphy sign noted.

Common bile duct: Diameter: 4 mm

Liver: No focal lesion identified. Within normal limits in
parenchymal echogenicity.

IVC: No abnormality visualized.

Pancreas: Visualized portion unremarkable.

Spleen: Size and appearance within normal limits.

Right Kidney: Length: 9.5 cm. Echogenicity within normal limits. No
mass or hydronephrosis visualized.

Left Kidney: Length: 9.4 cm. Echogenicity within normal limits. No
mass or hydronephrosis visualized. Simple cyst in the upper pole of
the left kidney measures 2.1 cm.

Abdominal aorta: No aneurysm visualized.

Other findings: None.
IMPRESSION: No acute intra-abdominal pathology.

## 2016-06-05 ENCOUNTER — Emergency Department (HOSPITAL_COMMUNITY)
Admission: EM | Admit: 2016-06-05 | Discharge: 2016-06-05 | Disposition: A | Discharge status: Home / Self Care | Payer: Self-pay | Attending: Emergency Medicine | Admitting: Emergency Medicine

## 2016-06-05 ENCOUNTER — Encounter (HOSPITAL_COMMUNITY): Payer: Self-pay | Admitting: Emergency Medicine

## 2016-06-05 ENCOUNTER — Emergency Department (HOSPITAL_COMMUNITY): Payer: Self-pay

## 2016-06-05 DIAGNOSIS — S50811A Abrasion of right forearm, initial encounter: Secondary | ICD-10-CM | POA: Insufficient documentation

## 2016-06-05 DIAGNOSIS — Y999 Unspecified external cause status: Secondary | ICD-10-CM | POA: Insufficient documentation

## 2016-06-05 DIAGNOSIS — S0101XA Laceration without foreign body of scalp, initial encounter: Secondary | ICD-10-CM | POA: Insufficient documentation

## 2016-06-05 DIAGNOSIS — Y939 Activity, unspecified: Secondary | ICD-10-CM | POA: Insufficient documentation

## 2016-06-05 DIAGNOSIS — S60511A Abrasion of right hand, initial encounter: Secondary | ICD-10-CM | POA: Insufficient documentation

## 2016-06-05 DIAGNOSIS — Y92410 Unspecified street and highway as the place of occurrence of the external cause: Secondary | ICD-10-CM | POA: Insufficient documentation

## 2016-06-05 DIAGNOSIS — S20411A Abrasion of right back wall of thorax, initial encounter: Secondary | ICD-10-CM | POA: Insufficient documentation

## 2016-06-05 DIAGNOSIS — I10 Essential (primary) hypertension: Secondary | ICD-10-CM | POA: Insufficient documentation

## 2016-06-05 DIAGNOSIS — E109 Type 1 diabetes mellitus without complications: Secondary | ICD-10-CM | POA: Insufficient documentation

## 2016-06-05 LAB — CBG MONITORING, ED: GLUCOSE-CAPILLARY: 479 mg/dL — AB (ref 65–99)

## 2016-06-05 NOTE — Discharge Instructions (Signed)
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
## 2016-06-05 NOTE — ED Notes (Signed)
Pt's CBG 479.  Informed [place], Charity fundraiserN.
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
## 2016-06-06 ENCOUNTER — Encounter (HOSPITAL_COMMUNITY): Payer: Self-pay | Admitting: Emergency Medicine

## 2016-06-06 ENCOUNTER — Emergency Department (HOSPITAL_COMMUNITY): Payer: Self-pay

## 2016-06-06 ENCOUNTER — Inpatient Hospital Stay (HOSPITAL_COMMUNITY)
Admission: EM | Admit: 2016-06-06 | Discharge: 2016-06-08 | DRG: 638 | Disposition: A | Discharge status: Home Health | Payer: Self-pay | Attending: Nephrology | Admitting: Nephrology

## 2016-06-06 DIAGNOSIS — S0101XA Laceration without foreign body of scalp, initial encounter: Secondary | ICD-10-CM

## 2016-06-06 DIAGNOSIS — Y9355 Activity, bike riding: Secondary | ICD-10-CM

## 2016-06-06 DIAGNOSIS — E860 Dehydration: Secondary | ICD-10-CM | POA: Diagnosis present

## 2016-06-06 DIAGNOSIS — F329 Major depressive disorder, single episode, unspecified: Secondary | ICD-10-CM | POA: Diagnosis present

## 2016-06-06 DIAGNOSIS — E109 Type 1 diabetes mellitus without complications: Secondary | ICD-10-CM | POA: Diagnosis present

## 2016-06-06 DIAGNOSIS — E1010 Type 1 diabetes mellitus with ketoacidosis without coma: Principal | ICD-10-CM | POA: Diagnosis present

## 2016-06-06 DIAGNOSIS — E1110 Type 2 diabetes mellitus with ketoacidosis without coma: Secondary | ICD-10-CM | POA: Diagnosis present

## 2016-06-06 DIAGNOSIS — Z794 Long term (current) use of insulin: Secondary | ICD-10-CM

## 2016-06-06 DIAGNOSIS — K219 Gastro-esophageal reflux disease without esophagitis: Secondary | ICD-10-CM | POA: Diagnosis present

## 2016-06-06 DIAGNOSIS — N179 Acute kidney failure, unspecified: Secondary | ICD-10-CM | POA: Diagnosis present

## 2016-06-06 DIAGNOSIS — S2239XA Fracture of one rib, unspecified side, initial encounter for closed fracture: Secondary | ICD-10-CM

## 2016-06-06 DIAGNOSIS — E785 Hyperlipidemia, unspecified: Secondary | ICD-10-CM | POA: Diagnosis present

## 2016-06-06 DIAGNOSIS — Z9114 Patient's other noncompliance with medication regimen: Secondary | ICD-10-CM

## 2016-06-06 DIAGNOSIS — S2241XA Multiple fractures of ribs, right side, initial encounter for closed fracture: Secondary | ICD-10-CM | POA: Diagnosis present

## 2016-06-06 DIAGNOSIS — I10 Essential (primary) hypertension: Secondary | ICD-10-CM | POA: Diagnosis present

## 2016-06-06 DIAGNOSIS — S0101XD Laceration without foreign body of scalp, subsequent encounter: Secondary | ICD-10-CM

## 2016-06-06 DIAGNOSIS — F1010 Alcohol abuse, uncomplicated: Secondary | ICD-10-CM | POA: Diagnosis present

## 2016-06-06 DIAGNOSIS — E871 Hypo-osmolality and hyponatremia: Secondary | ICD-10-CM | POA: Diagnosis present

## 2016-06-06 DIAGNOSIS — T383X6A Underdosing of insulin and oral hypoglycemic [antidiabetic] drugs, initial encounter: Secondary | ICD-10-CM | POA: Diagnosis present

## 2016-06-06 LAB — BASIC METABOLIC PANEL
ANION GAP: 20 — AB (ref 5–15)
Anion gap: 28 — ABNORMAL HIGH (ref 5–15)
BUN: 20 mg/dL (ref 6–20)
BUN: 16 mg/dL (ref 6–20)
CALCIUM: 9.5 mg/dL (ref 8.9–10.3)
CHLORIDE: 103 mmol/L (ref 101–111)
CO2: 9 mmol/L — ABNORMAL LOW (ref 22–32)
CO2: 10 mmol/L — ABNORMAL LOW (ref 22–32)
Calcium: 8 mg/dL — ABNORMAL LOW (ref 8.9–10.3)
Chloride: 89 mmol/L — ABNORMAL LOW (ref 101–111)
Creatinine, Ser: 1.86 mg/dL — ABNORMAL HIGH (ref 0.61–1.24)
Creatinine, Ser: 1.67 mg/dL — ABNORMAL HIGH (ref 0.61–1.24)
GFR calc Af Amer: 47 mL/min — ABNORMAL LOW (ref >60)
GFR calc non Af Amer: 41 mL/min — ABNORMAL LOW (ref >60)
GFR calc non Af Amer: 47 mL/min — ABNORMAL LOW (ref >60)
GFR, EST AFRICAN AMERICAN: 54 mL/min — AB (ref >60)
GLUCOSE: 611 mg/dL — AB (ref 65–99)
Glucose, Bld: 327 mg/dL — ABNORMAL HIGH (ref 65–99)
POTASSIUM: 5.3 mmol/L — AB (ref 3.5–5.1)
POTASSIUM: 4.2 mmol/L (ref 3.5–5.1)
SODIUM: 133 mmol/L — AB (ref 135–145)
Sodium: 126 mmol/L — ABNORMAL LOW (ref 135–145)

## 2016-06-06 LAB — CBC
HEMATOCRIT: 40.4 % (ref 39.0–52.0)
Hemoglobin: 13 g/dL (ref 13.0–17.0)
MCH: 33.2 pg (ref 26.0–34.0)
MCHC: 32.2 g/dL (ref 30.0–36.0)
MCV: 103.1 fL — ABNORMAL HIGH (ref 78.0–100.0)
Platelets: 311 10*3/uL (ref 150–400)
RBC: 3.92 MIL/uL — ABNORMAL LOW (ref 4.22–5.81)
RDW: 12.2 % (ref 11.5–15.5)
WBC: 9.3 10*3/uL (ref 4.0–10.5)

## 2016-06-06 LAB — URINALYSIS, ROUTINE W REFLEX MICROSCOPIC
BACTERIA UA: NONE SEEN
Bilirubin Urine: NEGATIVE
Glucose, UA: >=500 mg/dL — AB
Ketones, ur: 80 mg/dL — AB
Leukocytes, UA: NEGATIVE
Nitrite: NEGATIVE
Protein, ur: 30 mg/dL — AB
SPECIFIC GRAVITY, URINE: 1.016 (ref 1.005–1.030)
Squamous Epithelial / LPF: NONE SEEN
WBC, UA: NONE SEEN WBC/hpf (ref 0–5)
pH: 5 (ref 5.0–8.0)

## 2016-06-06 LAB — CBG MONITORING, ED
GLUCOSE-CAPILLARY: 589 mg/dL — AB (ref 65–99)
GLUCOSE-CAPILLARY: 464 mg/dL — AB (ref 65–99)
GLUCOSE-CAPILLARY: 409 mg/dL — AB (ref 65–99)
Glucose-Capillary: 543 mg/dL (ref 65–99)

## 2016-06-06 LAB — MRSA PCR SCREENING: MRSA BY PCR: NEGATIVE

## 2016-06-06 LAB — ETHANOL

## 2016-06-06 LAB — GLUCOSE, CAPILLARY
Glucose-Capillary: 328 mg/dL — ABNORMAL HIGH (ref 65–99)
Glucose-Capillary: 250 mg/dL — ABNORMAL HIGH (ref 65–99)
Glucose-Capillary: 177 mg/dL — ABNORMAL HIGH (ref 65–99)

## 2016-06-06 MED ORDER — LORAZEPAM 2 MG/ML IJ SOLN: 1.0000 mg | Freq: Four times a day (QID) | INTRAMUSCULAR | Status: DC | PRN

## 2016-06-06 MED ORDER — THIAMINE HCL 100 MG/ML IJ SOLN
100.0000 mg | Freq: Every day | INTRAMUSCULAR | Status: DC
  Filled 2016-06-06: qty 2

## 2016-06-06 MED ORDER — SODIUM CHLORIDE 0.9 % IV BOLUS (SEPSIS)
2000.0000 mL | Freq: Once | INTRAVENOUS | Status: AC
  Administered 2016-06-06: 2000 mL via INTRAVENOUS

## 2016-06-06 MED ORDER — SODIUM CHLORIDE 0.9 % IV SOLN
INTRAVENOUS | Status: AC
  Administered 2016-06-06: 19:00:00 via INTRAVENOUS

## 2016-06-06 MED ORDER — OXYCODONE-ACETAMINOPHEN 5-325 MG PO TABS
1.0000 | ORAL_TABLET | ORAL | Status: DC | PRN
  Administered 2016-06-07 – 2016-06-08 (×4): 1 via ORAL
  Filled 2016-06-06 (×4): qty 1

## 2016-06-06 MED ORDER — SODIUM CHLORIDE 0.9 % IV SOLN
INTRAVENOUS | Status: DC
  Administered 2016-06-06: 23:00:00 via INTRAVENOUS

## 2016-06-06 MED ORDER — DIPHENHYDRAMINE HCL 50 MG/ML IJ SOLN
12.5000 mg | Freq: Once | INTRAMUSCULAR | Status: AC
Start: 2016-06-06 — End: 2016-06-06
  Administered 2016-06-06: 12.5 mg via INTRAVENOUS
  Filled 2016-06-06: qty 1

## 2016-06-06 MED ORDER — ENOXAPARIN SODIUM 30 MG/0.3ML ~~LOC~~ SOLN: 30.0000 mg | SUBCUTANEOUS | Status: DC

## 2016-06-06 MED ORDER — DEXTROSE-NACL 5-0.45 % IV SOLN: INTRAVENOUS | Status: DC

## 2016-06-06 MED ORDER — VITAMIN B-1 100 MG PO TABS
100.0000 mg | ORAL_TABLET | Freq: Every day | ORAL | Status: DC
  Administered 2016-06-07: 100 mg via ORAL
  Filled 2016-06-06: qty 1

## 2016-06-06 MED ORDER — ONDANSETRON HCL 4 MG/2ML IJ SOLN
4.0000 mg | Freq: Once | INTRAMUSCULAR | Status: AC
  Administered 2016-06-06: 4 mg via INTRAVENOUS
  Filled 2016-06-06: qty 2

## 2016-06-06 MED ORDER — LORAZEPAM 1 MG PO TABS
1.0000 mg | ORAL_TABLET | Freq: Four times a day (QID) | ORAL | Status: DC | PRN
  Administered 2016-06-07 – 2016-06-08 (×4): 1 mg via ORAL
  Filled 2016-06-06 (×4): qty 1

## 2016-06-06 MED ORDER — ADULT MULTIVITAMIN W/MINERALS CH
1.0000 | ORAL_TABLET | Freq: Every day | ORAL | Status: DC
  Administered 2016-06-07 – 2016-06-08 (×2): 1 via ORAL
  Filled 2016-06-06 (×2): qty 1

## 2016-06-06 MED ORDER — DEXTROSE-NACL 5-0.45 % IV SOLN
INTRAVENOUS | Status: DC
  Administered 2016-06-06: 23:00:00 via INTRAVENOUS

## 2016-06-06 MED ORDER — SODIUM CHLORIDE 0.9 % IV SOLN
INTRAVENOUS | Status: DC
  Filled 2016-06-06: qty 1

## 2016-06-06 MED ORDER — MORPHINE SULFATE (PF) 4 MG/ML IV SOLN
4.0000 mg | Freq: Once | INTRAVENOUS | Status: AC
  Administered 2016-06-06: 4 mg via INTRAVENOUS
  Filled 2016-06-06: qty 1

## 2016-06-06 MED ORDER — POLYETHYLENE GLYCOL 3350 17 G PO PACK: 17.0000 g | PACK | Freq: Every day | ORAL | Status: DC | PRN

## 2016-06-06 MED ORDER — SODIUM CHLORIDE 0.9 % IV SOLN
INTRAVENOUS | Status: DC
  Administered 2016-06-06: 4 [IU]/h via INTRAVENOUS
  Filled 2016-06-06: qty 1

## 2016-06-06 MED ORDER — SODIUM CHLORIDE 0.9 % IV BOLUS (SEPSIS): 1000.0000 mL | Freq: Once | INTRAVENOUS | Status: DC

## 2016-06-06 MED ORDER — POTASSIUM CHLORIDE 10 MEQ/100ML IV SOLN: 10.0000 meq | INTRAVENOUS | Status: DC

## 2016-06-06 MED ORDER — FOLIC ACID 1 MG PO TABS
1.0000 mg | ORAL_TABLET | Freq: Every day | ORAL | Status: DC
  Administered 2016-06-07 – 2016-06-08 (×2): 1 mg via ORAL
  Filled 2016-06-06 (×2): qty 1

## 2016-06-06 MED ORDER — FAMOTIDINE 200 MG/20ML IV SOLN
10.0000 mg | Freq: Two times a day (BID) | INTRAVENOUS | Status: DC
  Administered 2016-06-06 – 2016-06-07 (×2): 10 mg via INTRAVENOUS
  Filled 2016-06-06 (×3): qty 1

## 2016-06-06 MED ORDER — FENTANYL CITRATE (PF) 100 MCG/2ML IJ SOLN
50.0000 ug | Freq: Once | INTRAMUSCULAR | Status: AC
Start: 2016-06-06 — End: 2016-06-06
  Administered 2016-06-06: 50 ug via INTRAVENOUS
  Filled 2016-06-06: qty 2

## 2016-06-06 MED ORDER — HYDROMORPHONE HCL 1 MG/ML IJ SOLN
0.5000 mg | INTRAMUSCULAR | Status: DC | PRN
  Administered 2016-06-06 – 2016-06-07 (×2): 0.5 mg via INTRAVENOUS
  Filled 2016-06-06 (×2): qty 1

## 2016-06-06 MED ORDER — ENOXAPARIN SODIUM 30 MG/0.3ML ~~LOC~~ SOLN
30.0000 mg | SUBCUTANEOUS | Status: DC
  Administered 2016-06-07: 30 mg via SUBCUTANEOUS
  Filled 2016-06-06: qty 0.3

## 2016-06-06 MED ORDER — SODIUM CHLORIDE 0.9 % IV SOLN
INTRAVENOUS | Status: DC
  Administered 2016-06-06: 19:00:00 via INTRAVENOUS
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
## 2016-06-06 NOTE — ED Notes (Signed)
Date and time results received: 06/06/16 1442 (use smartphrase ".now" to insert current time)  Test: Glucose Critical Value: 611  Name of Provider Notified: Dr Judd Lienelo  Orders Received? Or Actions Taken?:

## 2016-06-06 NOTE — ED Notes (Signed)
Pt ambulated to room. Got him on monitor and in gown

## 2016-06-06 NOTE — ED Notes (Signed)
PT did not have to use RR at this time  
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
## 2016-06-06 NOTE — ED Notes (Signed)
Patient transported to X-ray 

## 2016-06-06 NOTE — ED Notes (Signed)
Mild Redness and itching present on patient's left arm above IV site after morphine administration, Dr. Anitra LauthPlunkett made aware.

## 2016-06-07 LAB — BASIC METABOLIC PANEL
ANION GAP: 6 (ref 5–15)
ANION GAP: 9 (ref 5–15)
Anion gap: 7 (ref 5–15)
BUN: 12 mg/dL (ref 6–20)
BUN: 10 mg/dL (ref 6–20)
BUN: 9 mg/dL (ref 6–20)
CALCIUM: 8.3 mg/dL — AB (ref 8.9–10.3)
CO2: 22 mmol/L (ref 22–32)
CO2: 19 mmol/L — AB (ref 22–32)
CO2: 22 mmol/L (ref 22–32)
Calcium: 8.1 mg/dL — ABNORMAL LOW (ref 8.9–10.3)
Calcium: 8.4 mg/dL — ABNORMAL LOW (ref 8.9–10.3)
Chloride: 109 mmol/L (ref 101–111)
Chloride: 107 mmol/L (ref 101–111)
Chloride: 104 mmol/L (ref 101–111)
Creatinine, Ser: 1.04 mg/dL (ref 0.61–1.24)
Creatinine, Ser: 0.99 mg/dL (ref 0.61–1.24)
Creatinine, Ser: 1.18 mg/dL (ref 0.61–1.24)
GFR calc Af Amer: >60 mL/min (ref >60)
GFR calc non Af Amer: >60 mL/min (ref >60)
GLUCOSE: 120 mg/dL — AB (ref 65–99)
GLUCOSE: 193 mg/dL — AB (ref 65–99)
GLUCOSE: 282 mg/dL — AB (ref 65–99)
POTASSIUM: 4 mmol/L (ref 3.5–5.1)
POTASSIUM: 4.3 mmol/L (ref 3.5–5.1)
POTASSIUM: 4.1 mmol/L (ref 3.5–5.1)
Sodium: 137 mmol/L (ref 135–145)
Sodium: 135 mmol/L (ref 135–145)
Sodium: 133 mmol/L — ABNORMAL LOW (ref 135–145)

## 2016-06-07 LAB — GLUCOSE, CAPILLARY
GLUCOSE-CAPILLARY: 143 mg/dL — AB (ref 65–99)
GLUCOSE-CAPILLARY: 179 mg/dL — AB (ref 65–99)
Glucose-Capillary: 117 mg/dL — ABNORMAL HIGH (ref 65–99)
Glucose-Capillary: 131 mg/dL — ABNORMAL HIGH (ref 65–99)
Glucose-Capillary: 118 mg/dL — ABNORMAL HIGH (ref 65–99)
Glucose-Capillary: 89 mg/dL (ref 65–99)
Glucose-Capillary: 254 mg/dL — ABNORMAL HIGH (ref 65–99)
Glucose-Capillary: 187 mg/dL — ABNORMAL HIGH (ref 65–99)

## 2016-06-07 LAB — RAPID URINE DRUG SCREEN, HOSP PERFORMED
Amphetamines: NOT DETECTED
BARBITURATES: NOT DETECTED
Benzodiazepines: NOT DETECTED
Cocaine: POSITIVE — AB
OPIATES: POSITIVE — AB
TETRAHYDROCANNABINOL: NOT DETECTED

## 2016-06-07 LAB — HIV ANTIBODY (ROUTINE TESTING W REFLEX): HIV Screen 4th Generation wRfx: NONREACTIVE

## 2016-06-07 LAB — PHOSPHORUS: Phosphorus: 2.1 mg/dL — ABNORMAL LOW (ref 2.5–4.6)

## 2016-06-07 LAB — MAGNESIUM: Magnesium: 1.5 mg/dL — ABNORMAL LOW (ref 1.7–2.4)

## 2016-06-07 MED ORDER — MAGNESIUM SULFATE 2 GM/50ML IV SOLN
2.0000 g | Freq: Once | INTRAVENOUS | Status: AC
  Administered 2016-06-07: 2 g via INTRAVENOUS
  Filled 2016-06-07: qty 50

## 2016-06-07 MED ORDER — INSULIN ASPART 100 UNIT/ML ~~LOC~~ SOLN
0.0000 [IU] | Freq: Three times a day (TID) | SUBCUTANEOUS | Status: DC
  Administered 2016-06-07: 2 [IU] via SUBCUTANEOUS
  Administered 2016-06-07 – 2016-06-08 (×2): 5 [IU] via SUBCUTANEOUS
  Administered 2016-06-08: 3 [IU] via SUBCUTANEOUS
  Administered 2016-06-08: 5 [IU] via SUBCUTANEOUS

## 2016-06-07 MED ORDER — INSULIN GLARGINE 100 UNIT/ML ~~LOC~~ SOLN
15.0000 [IU] | Freq: Every day | SUBCUTANEOUS | Status: DC
  Administered 2016-06-07 – 2016-06-08 (×2): 15 [IU] via SUBCUTANEOUS
  Filled 2016-06-07 (×2): qty 0.15

## 2016-06-07 MED ORDER — VITAMIN B-1 100 MG PO TABS
100.0000 mg | ORAL_TABLET | Freq: Every day | ORAL | Status: DC
  Administered 2016-06-08: 100 mg via ORAL
  Filled 2016-06-07: qty 1
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
## 2016-06-07 NOTE — Progress Notes (Signed)
Paged MD on call updated him on CBG while on insulin gtt to ask about transition off. However waiting on lab value. Lab value -gap came back I paged MD x2 then received call back to transition patient off insulin gtt.

## 2016-06-08 LAB — GLUCOSE, CAPILLARY
GLUCOSE-CAPILLARY: 246 mg/dL — AB (ref 65–99)
GLUCOSE-CAPILLARY: 216 mg/dL — AB (ref 65–99)
GLUCOSE-CAPILLARY: 260 mg/dL — AB (ref 65–99)
GLUCOSE-CAPILLARY: 285 mg/dL — AB (ref 65–99)

## 2016-06-08 MED ORDER — METHOCARBAMOL 500 MG PO TABS: 500.0000 mg | ORAL_TABLET | Freq: Three times a day (TID) | ORAL | 0 refills | Status: AC

## 2016-06-08 MED ORDER — POLYETHYLENE GLYCOL 3350 17 G PO PACK: 17.0000 g | PACK | Freq: Every day | ORAL | 0 refills | Status: AC | PRN

## 2016-06-08 MED ORDER — OXYCODONE-ACETAMINOPHEN 5-325 MG PO TABS: 1.0000 | ORAL_TABLET | Freq: Three times a day (TID) | ORAL | 0 refills | Status: AC | PRN

## 2016-06-08 MED ORDER — INSULIN ASPART PROT & ASPART (70-30 MIX) 100 UNIT/ML ~~LOC~~ SUSP: 12.0000 [IU] | Freq: Two times a day (BID) | SUBCUTANEOUS | 0 refills | Status: AC

## 2016-06-08 MED ORDER — ADULT MULTIVITAMIN W/MINERALS CH: 1.0000 | ORAL_TABLET | Freq: Every day | ORAL | 0 refills | Status: AC

## 2016-06-08 MED ORDER — METHOCARBAMOL 500 MG PO TABS
500.0000 mg | ORAL_TABLET | Freq: Three times a day (TID) | ORAL | Status: DC
  Administered 2016-06-08 (×2): 500 mg via ORAL
  Filled 2016-06-08 (×2): qty 1

## 2016-06-08 MED ORDER — INSULIN PEN NEEDLE 31G X 5 MM MISC: 0 refills | Status: AC

## 2016-06-08 MED ORDER — INSULIN GLARGINE 100 UNIT/ML SOLOSTAR PEN: 15.0000 [IU] | PEN_INJECTOR | Freq: Every day | SUBCUTANEOUS | 0 refills | Status: DC

## 2016-06-08 MED ORDER — INSULIN ASPART 100 UNIT/ML FLEXPEN: 3.0000 [IU] | PEN_INJECTOR | Freq: Three times a day (TID) | SUBCUTANEOUS | 0 refills | Status: DC

## 2016-06-08 MED ORDER — INSULIN ASPART PROT & ASPART (70-30 MIX) 100 UNIT/ML ~~LOC~~ SUSP: 12.0000 [IU] | Freq: Two times a day (BID) | SUBCUTANEOUS | 0 refills | Status: DC

## 2016-06-08 MED ORDER — BLOOD GLUCOSE MONITOR KIT: PACK | 0 refills | Status: AC
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
## 2016-06-08 NOTE — Progress Notes (Signed)
Patient is waiting on a ride in order to be discharged. Called [facility] pharmacy on [address] to verify they are open 24 hours a day and [facility]. ALso verified that they have 70/30 insulin in stock for patient. Will continue to monitor.
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
## 2016-06-08 NOTE — Progress Notes (Addendum)
Inpatient Diabetes Program Recommendations  AACE/ADA: New Consensus Statement on Inpatient Glycemic Control (2015)  Target Ranges:  Prepandial:   less than 140 mg/dL      Peak postprandial:   less than 180 mg/dL (1-2 hours)      Critically ill patients:  140 - 180 mg/dL   Lab Results  Component Value Date   GLUCAP 216 (H) 06/08/2016   HGBA1C 8.4 (H) 07/09/2011    Review of Glycemic Control  Diabetes history: DM1 Outpatient Diabetes medications: Patient states he ran out of Lantus approx. 1 month ago and only been taking Novolog injections Current orders for Inpatient glycemic control: Lantus 15 units daily + Novolog correction 0-9 tid  Inpatient Diabetes Program Recommendations:  Spoke with patient @ beside. Patient shared he has been on 70/30 insulin bid in the past and willing to take again if prescribed. Spoke with case manager Linus Salmonsebra Taylor and [facility] Patient states he cannot afford to purchase Lantus until he has insurance again. If transitioned to 70/30 12 units bid would equal approx 7 units meal coverage daily + 16.8 units basal.  Thank you, Billy FischerJudy E. Edita Weyenberg, RN, MSN, CDE  Diabetes Coordinator Inpatient Glycemic Control Team Team Pager [phone] (8am-5pm) 06/08/2016 11:41 AM

## 2016-06-08 NOTE — Discharge Instructions (Signed)

## 2016-06-08 NOTE — Progress Notes (Signed)
Clinical Social Worker was consulted by patient RN stating that patient needs some resources for outpatient substance use disorder. Patient stated that he has tired to quite drinking but he feels that he will not be able to completely stop without the help of a facility. Patient stated he is interested in an inpatient stay that will help patient with his drinking. CSW gave patient resources for both inpatient and outpatient drug rehabilitation place. CSW encouraged patient to look over the paperwork and reach out to a facility he feels like would help him. CSW signing of at this time as patient has received resources for rehab facilities.  Marrianne MoodAshley Loral Campi, MSW,  [employer] [phone]

## 2016-06-08 NOTE — Progress Notes (Signed)
Discussed discharge instructions and medications with patient. Dc'ed PIVs. Called pharmacy, [facility] is open [facility]/7 and has his insulin in stock. Patient aware to go to pharmacy and get meds and make follow up apts tomorrow. verbalized understanding with all questions answered. VSS. Pt discharged home with mom and dad.  [facility] Seriah Brotzman,RN
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
## 2016-06-08 NOTE — Care Management Note (Signed)
Case Management Note  Patient Details  Name: Jonathan Colon MRN: [id number] Date of Birth: 12/31/1965  Subjective/Objective:    From home alone, pta indep, presents with DKA, rib fxs,  weakness , abd pain.  Patient will be on insulin, he will need to go to [facility] at discharge to get his insulin and supplies.  [facility] since he does not have funds.  NCM will give patient a Match Letter.  They do have the insulin 70/30 in vials and pens.  Patient is for dc today.  Per orders , for HHPT, OT, RN and aide,  Patient will not be able to [facility].  He also needs a cane.                Action/Plan:   Expected Discharge Date:  06/08/16               Expected Discharge Plan:  Home w Home Health Services  In-House Referral:     Discharge planning Services  CM Consult, MATCH Program, Indigent Health Clinic  Post Acute Care Choice:  Durable Medical Equipment Choice offered to:  Patient  DME Arranged:  Gilmer Morane DME Agency:  [facility]  HH [facility]  [facility]  [facility]  Status of Service:  Completed, signed off  If discussed at Long Length of Stay Meetings, dates discussed:    Additional Comments:  Leone Havenaylor, Howard Bunte Clinton, RN 06/08/2016, 12:23 PM

## 2019-08-01 ENCOUNTER — Emergency Department (HOSPITAL_COMMUNITY)
Admission: EM | Admit: 2019-08-01 | Discharge: 2019-08-01 | Discharge status: Against Medical Advice | Payer: 59 | Attending: Emergency Medicine | Admitting: Emergency Medicine

## 2019-08-01 ENCOUNTER — Encounter (HOSPITAL_COMMUNITY): Payer: Self-pay | Admitting: Emergency Medicine

## 2019-08-01 DIAGNOSIS — I10 Essential (primary) hypertension: Secondary | ICD-10-CM | POA: Diagnosis not present

## 2019-08-01 DIAGNOSIS — Z5321 Procedure and treatment not carried out due to patient leaving prior to being seen by health care provider: Secondary | ICD-10-CM | POA: Diagnosis not present

## 2019-08-01 DIAGNOSIS — E10649 Type 1 diabetes mellitus with hypoglycemia without coma: Secondary | ICD-10-CM | POA: Diagnosis present

## 2019-08-01 DIAGNOSIS — E162 Hypoglycemia, unspecified: Secondary | ICD-10-CM

## 2019-08-01 DIAGNOSIS — Z794 Long term (current) use of insulin: Secondary | ICD-10-CM | POA: Diagnosis not present

## 2019-08-01 DIAGNOSIS — Z79899 Other long term (current) drug therapy: Secondary | ICD-10-CM | POA: Insufficient documentation

## 2019-08-01 DIAGNOSIS — Z5329 Procedure and treatment not carried out because of patient's decision for other reasons: Secondary | ICD-10-CM

## 2019-08-01 LAB — CBG MONITORING, ED
Glucose-Capillary: 155 mg/dL — ABNORMAL HIGH (ref 70–99)
Glucose-Capillary: 38 mg/dL — CL (ref 70–99)
Glucose-Capillary: 250 mg/dL — ABNORMAL HIGH (ref 70–99)

## 2019-08-01 MED ORDER — DEXTROSE 50 % IV SOLN
INTRAVENOUS | Status: AC
  Filled 2019-08-01: qty 50

## 2019-08-01 NOTE — ED Notes (Signed)
Patient to leave AMA. CBG rechecked and noted to be 250. Food provided to patient before discharge.  No e-signature available, verbalized understanding of instructions, follow up care, and risks of leaving AMA.  Ambulatory and stable

## 2019-08-01 NOTE — ED Notes (Signed)
Patient requesting to leave AMA. Dr Donnald Garre aware.

## 2019-08-01 NOTE — ED Provider Notes (Signed)
[facility] EMERGENCY DEPARTMENT Provider Note   CSN: [id number] Arrival date & time: 08/01/19  2034     History Chief Complaint  Patient presents with  . Altered Mental Status    Jonathan Colon is a 54 y.o. male.  HPI Patient reports that he was going to an [place].  He has a known history of diabetes.  Patient reports that he did really well with his diabetes when he had an insulin pump.  He reports for several years now he has been back on 70/30 insulin.  He is a dialysis nurse.  Patient reports he was extremely busy at work today and could not eat lunch until later in the day.  He reports he ate a light lunch just before 3pm and took his 70/30 insulin at 2:45.  He had not had anything else to eat.  And he went to his AA meeting his friend noticed that he was stumbling and confused.  Patient was tremulous and sweaty.  They brought him to the emergency department and blood sugar found to be 38.  Patient was given an amp of D50 and symptoms improved.  Patient reports he has felt well recently.  He denies has been sick.  He denies any recent fever or chills.  He has not had problems with vomiting or diarrhea.  Patient is very insistent that he must leave soon.  He reports he has to be at work at 5 AM and does not wish to stay in the emergency department much longer.    Past Medical History:  Diagnosis Date  . Depression   . Diabetes mellitus    has current insulin pump  . DKA (diabetic ketoacidoses) ([place])   . DKA (diabetic ketoacidoses) ([place]) 08/01/2014  . GERD (gastroesophageal reflux disease)   . Hypercholesteremia   . Hypertension     Patient Active Problem List   Diagnosis Date Noted  . Closed rib fracture 06/06/2016  . Scalp laceration 06/06/2016  . Dyspnea 08/01/2014  . Hypercalcemia 08/01/2014  . Indigestion 08/01/2014  . AKI (acute kidney injury) ([place]) 08/01/2014  . ARF (acute renal failure) ([place]) 08/23/2012  . DKA (diabetic ketoacidoses) ([place])  07/09/2011  . DM type 1 (diabetes mellitus, type 1) ([place]) 07/09/2011  . HTN (hypertension) 07/09/2011  . HLD (hyperlipidemia) 07/09/2011  . Depression 07/09/2011  . Avitaminosis D 08/02/2009  . 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 EYE SURGERY     1992  . VASECTOMY         No family history on file.  Social History   Tobacco Use  . Smoking status: Never Smoker  . Smokeless tobacco: Never Used  Vaping Use  . Vaping Use: Never used  Substance Use Topics  . Alcohol use: Yes    Alcohol/week: 12.0 - 24.0 standard drinks    Types: 12 - 24 Cans of beer per week    Comment: 2 days ago--  . Drug use: No    Home Medications Prior to Admission medications   Medication Sig Start Date End Date Taking? Authorizing Provider  amLODipine-benazepril (LOTREL) 10-20 MG capsule Take 1 capsule by mouth daily.  06/20/19  Yes [provider]  blood glucose meter kit and supplies KIT Dispense based on patient and insurance preference. Use up to four times daily as directed. (FOR ICD-9 250.00, 250.01). Patient taking differently: Inject 1 each into the skin See admin instructions. Dispense based on patient and  insurance preference. Use up to four times daily as directed. (FOR ICD-9 250.00, 250.01). 06/08/16  Yes Rosita Fire, MD  insulin NPH-regular Human (70-30) 100 UNIT/ML injection Inject 40-45 Units into the skin in the morning and at bedtime. 09/30/17  Yes [provider]  Insulin Pen Needle 31G X 5 MM MISC Use 3-4 times a day Patient taking differently: 1 each by Other route See admin instructions. Use 3-4 times a day 06/08/16  Yes Rosita Fire, MD  loratadine (CLARITIN) 10 MG tablet Take 10 mg by mouth daily as needed for allergies.   Yes [provider]  omeprazole (PRILOSEC) 20 MG capsule Take 20 mg by mouth daily. 05/11/18  Yes [provider]  PARoxetine (PAXIL) 10 MG tablet Take 10 mg by mouth  daily. 07/14/19  Yes [provider]  amLODipine-benazepril (LOTREL) 10-20 MG capsule Take 1 capsule by mouth daily. Patient not taking: Reported on 08/01/2019 06/20/19   [provider]  cetirizine (ZYRTEC) 10 MG tablet Take 10 mg by mouth daily. Patient not taking: Reported on 08/01/2019    [provider]  insulin aspart protamine- aspart (NOVOLOG MIX 70/30) (70-30) 100 UNIT/ML injection Inject 0.12 mLs (12 Units total) into the skin 2 (two) times daily with a meal. Patient not taking: Reported on 08/01/2019 06/08/16   Rosita Fire, MD  methocarbamol (ROBAXIN) 500 MG tablet Take 1 tablet (500 mg total) by mouth 3 (three) times daily. Patient not taking: Reported on 08/01/2019 06/08/16   Rosita Fire, MD  Multiple Vitamin (MULTIVITAMIN WITH MINERALS) TABS tablet Take 1 tablet by mouth daily. Patient not taking: Reported on 08/01/2019 06/08/16   Rosita Fire, MD  omeprazole (PRILOSEC OTC) 20 MG tablet Take 20 mg by mouth daily. Patient not taking: Reported on 08/01/2019    [provider]  oxyCODONE-acetaminophen (PERCOCET/ROXICET) 5-325 MG tablet Take 1 tablet by mouth every 8 (eight) hours as needed for moderate pain. Patient not taking: Reported on 08/01/2019 06/08/16   Rosita Fire, MD  polyethylene glycol [facility] / Floria Raveling) packet Take 17 g by mouth daily as needed for mild constipation. Patient not taking: Reported on 08/01/2019 06/08/16   Rosita Fire, MD    Allergies    Patient has no known allergies.  Review of Systems   Review of Systems 10 systems reviewed and negative except as per HPI. Physical Exam Updated Vital Signs BP (!) 170/74 (BP Location: Right Arm)   Pulse 92   Temp (!) 96.8 F (36 C) (Temporal)   Resp 18   Ht _0  (1.727 m)   Wt 74.8 kg   SpO2 100%   BMI 25.09 kg/m   Physical Exam Constitutional:      Comments: Alert and nontoxic.  Well-nourished well-developed.  No respiratory distress.   Mental status clear.  HENT:     Head: Normocephalic and atraumatic.     Mouth/Throat:     Mouth: Mucous membranes are moist.     Pharynx: Oropharynx is clear.  Eyes:     Extraocular Movements: Extraocular movements intact.     Pupils: Pupils are equal, round, and reactive to light.  Cardiovascular:     Rate and Rhythm: Normal rate and regular rhythm.     Pulses: Normal pulses.     Heart sounds: Normal heart sounds.  Pulmonary:     Effort: Pulmonary effort is normal.     Breath sounds: Normal breath sounds.  Abdominal:     General: There is  no distension.     Palpations: Abdomen is soft.     Tenderness: There is no abdominal tenderness.  Musculoskeletal:        General: No swelling or tenderness. Normal range of motion.     Right lower leg: No edema.     Left lower leg: No edema.  Skin:    General: Skin is warm and dry.  Neurological:     General: No focal deficit present.     Mental Status: He is oriented to person, place, and time.     Coordination: Coordination normal.  Psychiatric:        Mood and Affect: Mood normal.     ED Results / Procedures / Treatments   Labs (all labs ordered are listed, but only abnormal results are displayed) Labs Reviewed  CBG MONITORING, ED - Abnormal; Notable for the following components:      Result Value   Glucose-Capillary 38 (*)    All other components within normal limits  CBG MONITORING, ED - Abnormal; Notable for the following components:   Glucose-Capillary 155 (*)    All other components within normal limits  CBG MONITORING, ED - Abnormal; Notable for the following components:   Glucose-Capillary 250 (*)    All other components within normal limits  BASIC METABOLIC PANEL  CBC    EKG None  Radiology No results found.  Procedures Procedures (including critical care time)  Medications Ordered in ED Medications  dextrose 50 % solution (  Given 08/01/19 2053)    ED Course  I have reviewed the triage vital signs and  the nursing notes.  Pertinent labs & imaging results that were available during my care of the patient were reviewed by me and considered in my medical decision making (see chart for details).    MDM Rules/Calculators/A&P                         Patient reports hypoglycemic due to inadequate oral intake with insulin.  He denies other positives on review of systems.  Once treated, patient was not agreeable to further observation and diagnostic evaluation.  Patient reports he has to be at work at 5 AM and wants to be home in bed by 10pm.  I did feel with acute hypoglycemia patient needed a brief period of observation and screening lab work.  Patient did not wish to proceed with this and advised that he was going to leave.  At this time will sign out AMA due to inability to adequately observe and repeat testing to confirm safety for discharge.  Final Clinical Impression(s) / ED Diagnoses Final diagnoses:  Hypoglycemia  Type 1 diabetes mellitus with hypoglycemia and without coma ([place])  Left against medical advice    Rx / DC Orders ED Discharge Orders    None       Charlesetta Shanks, MD 08/01/19 2145

## 2019-08-01 NOTE — Discharge Instructions (Signed)
1.  See your doctor as scheduled. 2.  Measure your blood sugar at least 3 times a day for the next several days.  If you get to feeling shaky, weak, sweaty or hungry immediately check your blood sugar and eat something if it is low. 3.  Return to the emergency department if you have recurrence of symptoms or any new or concerning symptoms.

## 2019-08-01 NOTE — ED Triage Notes (Signed)
Brought in by friend for confusion/AMS onset 30 min PTA. Patient is Type 1 diabetic and CBG noted to be 38 in triage. Alert, oriented to self only.
